# Patient Record
Sex: Female | Born: 1980 | Race: Black or African American | Hispanic: No | Marital: Married | Smoking: Never smoker
Health system: Southern US, Community
[De-identification: ages and names within clinical notes are randomized; demographics above are authoritative.]

## PROBLEM LIST (undated history)

## (undated) DIAGNOSIS — I10 Essential (primary) hypertension: Secondary | ICD-10-CM

## (undated) DIAGNOSIS — B001 Herpesviral vesicular dermatitis: Secondary | ICD-10-CM

## (undated) DIAGNOSIS — E1165 Type 2 diabetes mellitus with hyperglycemia: Principal | ICD-10-CM

## (undated) DIAGNOSIS — N926 Irregular menstruation, unspecified: Secondary | ICD-10-CM

## (undated) DIAGNOSIS — F32A Depression, unspecified: Principal | ICD-10-CM

## (undated) DIAGNOSIS — E119 Type 2 diabetes mellitus without complications: Secondary | ICD-10-CM

## (undated) DIAGNOSIS — F419 Anxiety disorder, unspecified: Secondary | ICD-10-CM

## (undated) DIAGNOSIS — E559 Vitamin D deficiency, unspecified: Secondary | ICD-10-CM

## (undated) HISTORY — DX: Type 2 diabetes mellitus without complications: E11.9

## (undated) HISTORY — DX: Anxiety disorder, unspecified: F41.9

---

## 2004-03-04 ENCOUNTER — Emergency Department: Payer: Self-pay | Admitting: Internal Medicine

## 2013-06-22 ENCOUNTER — Ambulatory Visit: Payer: Self-pay | Admitting: Nurse Practitioner

## 2013-07-07 ENCOUNTER — Ambulatory Visit: Payer: Self-pay | Admitting: Nurse Practitioner

## 2014-01-02 ENCOUNTER — Ambulatory Visit: Payer: Self-pay | Admitting: Emergency Medicine

## 2014-06-15 ENCOUNTER — Other Ambulatory Visit: Payer: Self-pay

## 2014-06-15 DIAGNOSIS — K219 Gastro-esophageal reflux disease without esophagitis: Secondary | ICD-10-CM | POA: Insufficient documentation

## 2014-06-16 ENCOUNTER — Encounter: Payer: Self-pay | Admitting: Family Medicine

## 2014-06-16 ENCOUNTER — Ambulatory Visit (INDEPENDENT_AMBULATORY_CARE_PROVIDER_SITE_OTHER): Payer: No Typology Code available for payment source | Admitting: Family Medicine

## 2014-06-16 ENCOUNTER — Ambulatory Visit: Payer: Self-pay | Admitting: Family Medicine

## 2014-06-16 VITALS — BP 120/70 | HR 70 | Ht 60.0 in | Wt 232.0 lb

## 2014-06-16 DIAGNOSIS — E119 Type 2 diabetes mellitus without complications: Secondary | ICD-10-CM | POA: Diagnosis not present

## 2014-06-16 DIAGNOSIS — F32A Depression, unspecified: Secondary | ICD-10-CM

## 2014-06-16 DIAGNOSIS — F329 Major depressive disorder, single episode, unspecified: Secondary | ICD-10-CM

## 2014-06-16 MED ORDER — PAROXETINE HCL 10 MG PO TABS: 10.0000 mg | ORAL_TABLET | Freq: Every day | ORAL | Status: DC

## 2014-06-16 MED ORDER — METFORMIN HCL 500 MG PO TABS: 500.0000 mg | ORAL_TABLET | Freq: Two times a day (BID) | ORAL | Status: DC

## 2014-06-16 NOTE — Progress Notes (Signed)
Name: Alicia Haynes   MRN: [id number]    DOB: 1980/04/06   Date:06/16/2014       Progress Note  Subjective  Chief Complaint  Chief Complaint  Patient presents with  . Diabetes    A1C- was due in May  . Panic Attack    Diabetes She presents for her follow-up diabetic visit. She has type 2 diabetes mellitus. The initial diagnosis of diabetes was made 1 year ago. Her disease course has been stable. There are no hypoglycemic associated symptoms. Pertinent negatives for diabetes include no blurred vision, no fatigue, no foot paresthesias, no foot ulcerations, no polydipsia, no polyphagia, no polyuria, no visual change and no weight loss. There are no hypoglycemic complications. Symptoms are stable. There are no diabetic complications. Current diabetic treatment includes oral agent (monotherapy). She is compliant with treatment all of the time. Her weight is decreasing steadily. She participates in exercise three times a week. She monitors blood glucose at home 1-2 x per day. Blood glucose monitoring compliance is good. There is no change in her home blood glucose trend. Her breakfast blood glucose is taken between 7-8 am. Her breakfast blood glucose range is generally 90-110 mg/dl. She does not see a podiatrist.Eye exam is current.  Mental Health Problem The primary symptoms include dysphoric mood. The primary symptoms do not include delusions, hallucinations, negative symptoms or somatic symptoms. The current episode started more than 1 month ago.  Additional symptoms of the illness do not include no anhedonia, no insomnia, no unexpected weight change, no fatigue, no euphoric mood, no decreased need for sleep, no poor judgment or no visual change. She does not admit to suicidal ideas. She does not have a plan to commit suicide. She does not contemplate harming herself.    No problem-specific assessment & plan notes found for this encounter.   No past medical history on file.  No past  surgical history on file.  No family history on file.  History   Social History  . Marital Status: Single    Spouse Name: N/A  . Number of Children: N/A  . Years of Education: N/A   Occupational History  . Not on file.   Social History Main Topics  . Smoking status: Never Smoker   . Smokeless tobacco: Not on file  . Alcohol Use: No  . Drug Use: No  . Sexual Activity: Not on file   Other Topics Concern  . Not on file   Social History Narrative  . No narrative on file    Allergies  Allergen Reactions  . Sulfa Antibiotics Swelling     Review of Systems  Constitutional: Negative for fever, chills, weight loss, fatigue and unexpected weight change.  HENT: Negative.   Eyes: Negative.  Negative for blurred vision.  Respiratory: Negative.   Cardiovascular: Negative.   Gastrointestinal: Negative.   Genitourinary: Negative.   Musculoskeletal: Negative.   Skin: Negative.  Negative for rash.  Neurological: Negative.   Endo/Heme/Allergies: Negative.  Negative for polydipsia and polyphagia.  Psychiatric/Behavioral: Positive for depression and dysphoric mood. Negative for hallucinations. The patient does not have insomnia.      Objective  Filed Vitals:   06/16/14 1421  BP: 120/70  Pulse: 70  Height: 5' (1.524 m)  Weight: 232 lb (105.235 kg)    Physical Exam  Constitutional: She is oriented to person, place, and time and well-developed, well-nourished, and in no distress. No distress.  HENT:  Head: Normocephalic and atraumatic.  Right Ear: External ear  normal.  Left Ear: External ear normal.  Nose: Nose normal.  Mouth/Throat: Oropharynx is clear and moist.  Eyes: Conjunctivae and EOM are normal. Pupils are equal, round, and reactive to light.  Neck: Normal range of motion. Neck supple. No thyromegaly present.  Cardiovascular: Normal rate, regular rhythm and intact distal pulses.   No murmur heard. Pulmonary/Chest: Effort normal and breath sounds normal. No  respiratory distress. She has no wheezes. She has no rales.  Abdominal: Soft. Bowel sounds are normal. She exhibits no distension. There is no tenderness. There is no guarding.  Musculoskeletal: Normal range of motion. She exhibits no edema or tenderness.  Lymphadenopathy:    She has no cervical adenopathy.  Neurological: She is alert and oriented to person, place, and time.  Skin: Skin is warm and dry.  Psychiatric: Mood and affect normal.      No results found for this or any previous visit (from the past 2160 hour(s)).   Assessment & Plan  Problem List Items Addressed This Visit    None    Visit Diagnoses    Type 2 diabetes mellitus without complication    -  Primary    Relevant Medications    metFORMIN (GLUCOPHAGE) 500 MG tablet    Other Relevant Orders    HgB A1c    Depression        Relevant Medications    PARoxetine (PAXIL) 10 MG tablet         Dr. Hayden [facility]  06/16/2014

## 2014-06-17 LAB — HEMOGLOBIN A1C
ESTIMATED AVERAGE GLUCOSE: 137 mg/dL
HEMOGLOBIN A1C: 6.4 % — AB (ref 4.8–5.6)

## 2014-09-26 ENCOUNTER — Encounter: Payer: Self-pay | Admitting: Family Medicine

## 2014-09-26 ENCOUNTER — Ambulatory Visit (INDEPENDENT_AMBULATORY_CARE_PROVIDER_SITE_OTHER): Payer: No Typology Code available for payment source | Admitting: Family Medicine

## 2014-09-26 VITALS — BP 110/80 | HR 60 | Ht 60.0 in | Wt 239.0 lb

## 2014-09-26 DIAGNOSIS — F32A Depression, unspecified: Secondary | ICD-10-CM

## 2014-09-26 DIAGNOSIS — J0110 Acute frontal sinusitis, unspecified: Secondary | ICD-10-CM

## 2014-09-26 DIAGNOSIS — F320 Major depressive disorder, single episode, mild: Secondary | ICD-10-CM

## 2014-09-26 DIAGNOSIS — F329 Major depressive disorder, single episode, unspecified: Secondary | ICD-10-CM

## 2014-09-26 MED ORDER — PAROXETINE HCL 10 MG PO TABS: 10.0000 mg | ORAL_TABLET | Freq: Every day | ORAL | Status: DC

## 2014-09-26 MED ORDER — AMOXICILLIN 500 MG PO CAPS
500.0000 mg | ORAL_CAPSULE | Freq: Three times a day (TID) | ORAL | Status: DC
Start: 2014-09-26 — End: 2014-12-19

## 2014-09-26 NOTE — Progress Notes (Addendum)
Name: Alicia Haynes   MRN: [id number]    DOB: [birth year]   Date:09/26/2014       Progress Note  Subjective  Chief Complaint  Chief Complaint  Patient presents with  . Sinusitis    swollen lymph nodes and headache    Sinusitis This is a new problem. The current episode started 1 to 4 weeks ago. The problem has been waxing and waning since onset. The maximum temperature recorded prior to her arrival was 100.4 - 100.9 F. Associated symptoms include chills, congestion, diaphoresis, headaches, neck pain, sinus pressure, a sore throat and swollen glands. Pertinent negatives include no coughing, ear pain, shortness of breath or sneezing. (Bilateral headache/frontal area) Past treatments include acetaminophen and oral decongestants. The treatment provided no relief.    No problem-specific assessment & plan notes found for this encounter.   Past Medical History  Diagnosis Date  . Anxiety   . Diabetes mellitus without complication     History reviewed. No pertinent past surgical history.  Family History  Problem Relation Age of Onset  . Cancer Mother   . Diabetes Mother   . Hypertension Mother     Social History   Social History  . Marital Status: Single    Spouse Name: N/A  . Number of Children: N/A  . Years of Education: N/A   Occupational History  . Not on file.   Social History Main Topics  . Smoking status: Never Smoker   . Smokeless tobacco: Not on file  . Alcohol Use: No  . Drug Use: No  . Sexual Activity: No   Other Topics Concern  . Not on file   Social History Narrative    Allergies  Allergen Reactions  . Sulfa Antibiotics Swelling     Review of Systems  Constitutional: Positive for chills and diaphoresis. Negative for fever, weight loss and malaise/fatigue.  HENT: Positive for congestion, sinus pressure and sore throat. Negative for ear discharge, ear pain and sneezing.   Eyes: Negative for blurred vision.  Respiratory: Negative for cough,  sputum production, shortness of breath and wheezing.   Cardiovascular: Negative for chest pain, palpitations and leg swelling.  Gastrointestinal: Negative for heartburn, nausea, abdominal pain, diarrhea, constipation, blood in stool and melena.  Genitourinary: Negative for dysuria, urgency, frequency and hematuria.  Musculoskeletal: Positive for neck pain. Negative for myalgias, back pain and joint pain.  Skin: Negative for rash.  Neurological: Positive for headaches. Negative for dizziness, tingling, sensory change and focal weakness.  Endo/Heme/Allergies: Negative for environmental allergies and polydipsia. Does not bruise/bleed easily.  Psychiatric/Behavioral: Negative for depression and suicidal ideas. The patient is not nervous/anxious and does not have insomnia.      Objective  Filed Vitals:   09/26/14 1507  BP: 110/80  Pulse: 60  Height: 5' (1.524 m)  Weight: 239 lb (108.41 kg)    Physical Exam  Constitutional: She is well-developed, well-nourished, and in no distress. No distress.  HENT:  Head: Normocephalic and atraumatic.  Right Ear: External ear normal.  Left Ear: External ear normal.  Nose: Nose normal.  Mouth/Throat: Oropharynx is clear and moist.  Eyes: Conjunctivae and EOM are normal. Pupils are equal, round, and reactive to light. Right eye exhibits no discharge. Left eye exhibits no discharge.  Neck: Normal range of motion. Neck supple. No JVD present. No thyromegaly present.  Cardiovascular: Normal rate, regular rhythm, normal heart sounds and intact distal pulses.  Exam reveals no gallop and no friction rub.   No murmur heard.  Pulmonary/Chest: Effort normal and breath sounds normal.  Abdominal: Soft. Bowel sounds are normal. She exhibits no mass. There is no tenderness. There is no guarding.  Musculoskeletal: Normal range of motion. She exhibits no edema.       Cervical back: She exhibits spasm.  Lymphadenopathy:    She has no cervical adenopathy.   Neurological: She is alert. She has normal reflexes.  Skin: Skin is warm and dry. She is not diaphoretic.  Psychiatric: Mood and affect normal.      Assessment & Plan  Problem List Items Addressed This Visit    None    Visit Diagnoses    Acute frontal sinusitis, recurrence not specified    -  Primary    rhinocort sample    Relevant Medications    amoxicillin (AMOXIL) 500 MG capsule    Major depressive disorder, single episode, mild        Relevant Medications    PARoxetine (PAXIL) 10 MG tablet    Depression        Relevant Medications    PARoxetine (PAXIL) 10 MG tablet         Dr. [facility]  09/26/2014

## 2014-10-24 ENCOUNTER — Other Ambulatory Visit: Payer: Self-pay

## 2014-12-19 ENCOUNTER — Encounter: Payer: Self-pay | Admitting: Family Medicine

## 2014-12-19 ENCOUNTER — Ambulatory Visit (INDEPENDENT_AMBULATORY_CARE_PROVIDER_SITE_OTHER): Payer: 59 | Admitting: Family Medicine

## 2014-12-19 VITALS — BP 120/80 | HR 76 | Ht 60.0 in | Wt 244.0 lb

## 2014-12-19 DIAGNOSIS — J0100 Acute maxillary sinusitis, unspecified: Secondary | ICD-10-CM

## 2014-12-19 MED ORDER — GUAIFENESIN-CODEINE 100-10 MG/5ML PO SOLN: 5.0000 mL | Freq: Three times a day (TID) | ORAL | Status: DC | PRN

## 2014-12-19 MED ORDER — AMOXICILLIN 500 MG PO CAPS: 500.0000 mg | ORAL_CAPSULE | Freq: Three times a day (TID) | ORAL | Status: DC

## 2014-12-19 NOTE — Progress Notes (Signed)
Name: Alicia Haynes   MRN: [id number]    DOB: 11-28-1980   Date:12/19/2014       Progress Note  Subjective  Chief Complaint  Chief Complaint  Patient presents with  . Sinusitis    cough and cong- otc not helping    Sinusitis This is a new problem. The current episode started in the past 7 days. The problem has been gradually worsening since onset. There has been no fever. Her pain is at a severity of 2/10. The pain is mild. Pertinent negatives include no chills, congestion, coughing, diaphoresis, ear pain, headaches, hoarse voice, neck pain, shortness of breath, sinus pressure, sneezing, sore throat or swollen glands. Past treatments include acetaminophen. The treatment provided moderate relief.    No problem-specific assessment & plan notes found for this encounter.   Past Medical History  Diagnosis Date  . Anxiety   . Diabetes mellitus without complication (HCC)     History reviewed. No pertinent past surgical history.  Family History  Problem Relation Age of Onset  . Cancer Mother   . Diabetes Mother   . Hypertension Mother     Social History   Social History  . Marital Status: Single    Spouse Name: N/A  . Number of Children: N/A  . Years of Education: N/A   Occupational History  . Not on file.   Social History Main Topics  . Smoking status: Never Smoker   . Smokeless tobacco: Not on file  . Alcohol Use: No  . Drug Use: No  . Sexual Activity: No   Other Topics Concern  . Not on file   Social History Narrative    Allergies  Allergen Reactions  . Sulfa Antibiotics Swelling     Review of Systems  Constitutional: Negative for fever, chills, weight loss, malaise/fatigue and diaphoresis.  HENT: Negative for congestion, ear discharge, ear pain, hoarse voice, sinus pressure, sneezing and sore throat.   Eyes: Negative for blurred vision.  Respiratory: Negative for cough, sputum production, shortness of breath and wheezing.   Cardiovascular:  Negative for chest pain, palpitations and leg swelling.  Gastrointestinal: Negative for heartburn, nausea, abdominal pain, diarrhea, constipation, blood in stool and melena.  Genitourinary: Negative for dysuria, urgency, frequency and hematuria.  Musculoskeletal: Negative for myalgias, back pain, joint pain and neck pain.  Skin: Negative for rash.  Neurological: Negative for dizziness, tingling, sensory change, focal weakness and headaches.  Endo/Heme/Allergies: Negative for environmental allergies and polydipsia. Does not bruise/bleed easily.  Psychiatric/Behavioral: Negative for depression and suicidal ideas. The patient is not nervous/anxious and does not have insomnia.      Objective  Filed Vitals:   12/19/14 1540  BP: 120/80  Pulse: 76  Height: 5' (1.524 m)  Weight: 244 lb (110.678 kg)    Physical Exam  Constitutional: She is well-developed, well-nourished, and in no distress. No distress.  HENT:  Head: Normocephalic and atraumatic.  Right Ear: External ear normal.  Left Ear: External ear normal.  Nose: Nose normal.  Mouth/Throat: Oropharynx is clear and moist.  Eyes: Conjunctivae and EOM are normal. Pupils are equal, round, and reactive to light. Right eye exhibits no discharge. Left eye exhibits no discharge.  Neck: Normal range of motion. Neck supple. No JVD present. No thyromegaly present.  Cardiovascular: Normal rate, regular rhythm, normal heart sounds and intact distal pulses.  Exam reveals no gallop and no friction rub.   No murmur heard. Pulmonary/Chest: Effort normal and breath sounds normal.  Abdominal: Soft. Bowel sounds  are normal. She exhibits no mass. There is no tenderness. There is no guarding.  Musculoskeletal: Normal range of motion. She exhibits no edema.  Lymphadenopathy:    She has no cervical adenopathy.  Neurological: She is alert. She has normal reflexes.  Skin: Skin is warm and dry. She is not diaphoretic.  Psychiatric: Mood and affect normal.   Nursing note and vitals reviewed.     Assessment & Plan  Problem List Items Addressed This Visit    None    Visit Diagnoses    Acute maxillary sinusitis, recurrence not specified    -  Primary    Relevant Medications    amoxicillin (AMOXIL) 500 MG capsule    guaiFENesin-codeine 100-10 MG/5ML syrup         Dr. Hayden Rasmusseneanna [facility]  12/19/2014

## 2014-12-26 ENCOUNTER — Ambulatory Visit: Payer: Self-pay | Admitting: Family Medicine

## 2015-01-25 ENCOUNTER — Encounter: Payer: Self-pay | Admitting: Family Medicine

## 2015-01-25 ENCOUNTER — Ambulatory Visit (INDEPENDENT_AMBULATORY_CARE_PROVIDER_SITE_OTHER): Payer: 59 | Admitting: Family Medicine

## 2015-01-25 VITALS — BP 118/88 | HR 70 | Ht 60.0 in | Wt 242.0 lb

## 2015-01-25 DIAGNOSIS — F32A Depression, unspecified: Secondary | ICD-10-CM

## 2015-01-25 DIAGNOSIS — F329 Major depressive disorder, single episode, unspecified: Secondary | ICD-10-CM

## 2015-01-25 DIAGNOSIS — E119 Type 2 diabetes mellitus without complications: Secondary | ICD-10-CM | POA: Insufficient documentation

## 2015-01-25 DIAGNOSIS — E669 Obesity, unspecified: Secondary | ICD-10-CM

## 2015-01-25 DIAGNOSIS — E785 Hyperlipidemia, unspecified: Secondary | ICD-10-CM

## 2015-01-25 MED ORDER — PAROXETINE HCL 10 MG PO TABS: 10.0000 mg | ORAL_TABLET | Freq: Every day | ORAL | Status: DC

## 2015-01-25 MED ORDER — METFORMIN HCL ER (MOD) 500 MG PO TB24: 500.0000 mg | ORAL_TABLET | Freq: Every day | ORAL | Status: DC

## 2015-01-25 NOTE — Progress Notes (Signed)
Name: Alicia Haynes   MRN: [id number]    DOB: 07/25/1980   Date:01/25/2015       Progress Note  Subjective  Chief Complaint  Chief Complaint  Patient presents with  . Diabetes    wants to change to ER version of Metformin    Diabetes She presents for her follow-up diabetic visit. She has type 2 diabetes mellitus. Her disease course has been stable. There are no hypoglycemic associated symptoms. Pertinent negatives for hypoglycemia include no dizziness, headaches or nervousness/anxiousness. Pertinent negatives for diabetes include no blurred vision, no chest pain, no fatigue, no foot paresthesias, no foot ulcerations, no polydipsia, no polyphagia, no polyuria, no visual change, no weakness and no weight loss. There are no hypoglycemic complications. Symptoms are stable. There are no diabetic complications. There are no known risk factors for coronary artery disease. Current diabetic treatment includes oral agent (monotherapy). She is following a generally healthy diet. She has not had a previous visit with a dietitian. She participates in exercise intermittently. Her breakfast blood glucose is taken between 8-9 am. Her breakfast blood glucose range is generally 110-130 mg/dl. An ACE inhibitor/angiotensin II receptor blocker is not being taken. She does not see a podiatrist.Eye exam is current (5/16).  Depression        This is a chronic problem.  The problem has been gradually improving since onset.  Associated symptoms include no decreased concentration, no fatigue, no helplessness, no hopelessness, does not have insomnia, no decreased interest, no myalgias, no headaches and no suicidal ideas.  Past treatments include SSRIs - Selective serotonin reuptake inhibitors.  Compliance with treatment is good.  Previous treatment provided moderate relief.   No problem-specific assessment & plan notes found for this encounter.   Past Medical History  Diagnosis Date  . Anxiety   . Diabetes mellitus  without complication (HCC)     History reviewed. No pertinent past surgical history.  Family History  Problem Relation Age of Onset  . Cancer Mother   . Diabetes Mother   . Hypertension Mother     Social History   Social History  . Marital Status: Single    Spouse Name: N/A  . Number of Children: N/A  . Years of Education: N/A   Occupational History  . Not on file.   Social History Main Topics  . Smoking status: Never Smoker   . Smokeless tobacco: Not on file  . Alcohol Use: No  . Drug Use: No  . Sexual Activity: No   Other Topics Concern  . Not on file   Social History Narrative    Allergies  Allergen Reactions  . Sulfa Antibiotics Swelling     Review of Systems  Constitutional: Negative for fever, chills, weight loss, malaise/fatigue and fatigue.  HENT: Negative for ear discharge, ear pain and sore throat.   Eyes: Negative for blurred vision.  Respiratory: Negative for cough, sputum production, shortness of breath and wheezing.   Cardiovascular: Negative for chest pain, palpitations and leg swelling.  Gastrointestinal: Negative for heartburn, nausea, abdominal pain, diarrhea, constipation, blood in stool and melena.  Genitourinary: Negative for dysuria, urgency, frequency and hematuria.  Musculoskeletal: Negative for myalgias, back pain, joint pain and neck pain.  Skin: Negative for rash.  Neurological: Negative for dizziness, tingling, sensory change, focal weakness, weakness and headaches.  Endo/Heme/Allergies: Negative for environmental allergies, polydipsia and polyphagia. Does not bruise/bleed easily.  Psychiatric/Behavioral: Positive for depression. Negative for suicidal ideas and decreased concentration. The patient is not nervous/anxious and  does not have insomnia.      Objective  Filed Vitals:   01/25/15 0808  BP: 118/88  Pulse: 70  Height: 5' (1.524 m)  Weight: 242 lb (109.77 kg)    Physical Exam  Constitutional: She is well-developed,  well-nourished, and in no distress. No distress.  HENT:  Head: Normocephalic and atraumatic.  Right Ear: External ear normal.  Left Ear: External ear normal.  Nose: Nose normal.  Mouth/Throat: Oropharynx is clear and moist.  Eyes: Conjunctivae and EOM are normal. Pupils are equal, round, and reactive to light. Right eye exhibits no discharge. Left eye exhibits no discharge.  Neck: Normal range of motion. Neck supple. No JVD present. No thyromegaly present.  Cardiovascular: Normal rate, regular rhythm, normal heart sounds and intact distal pulses.  Exam reveals no gallop and no friction rub.   No murmur heard. Pulmonary/Chest: Effort normal and breath sounds normal.  Abdominal: Soft. Bowel sounds are normal. She exhibits no mass. There is no tenderness. There is no guarding.  Musculoskeletal: Normal range of motion. She exhibits no edema.  Lymphadenopathy:    She has no cervical adenopathy.  Neurological: She is alert. She has normal sensation, normal reflexes and intact cranial nerves.  Skin: Skin is warm and dry. She is not diaphoretic.  Psychiatric: Mood and affect normal.  Nursing note and vitals reviewed.     Assessment & Plan  Problem List Items Addressed This Visit      Endocrine   Type 2 diabetes mellitus without complication, without long-term current use of insulin (HCC) - Primary   Relevant Medications   metFORMIN (GLUMETZA) 500 MG (MOD) 24 hr tablet   Other Relevant Orders   HgB A1c   Renal Function Panel   Urine Microalbumin w/creat. ratio    Other Visit Diagnoses    Depression        Relevant Medications    PARoxetine (PAXIL) 10 MG tablet    Hyperlipidemia        Relevant Orders    Lipid Profile    Obesity        Relevant Medications    metFORMIN (GLUMETZA) 500 MG (MOD) 24 hr tablet    Other Relevant Orders    Lipid Profile         Dr. Elizabeth Sauer [facility]  01/25/2015

## 2015-01-25 NOTE — Patient Instructions (Signed)

## 2015-01-26 LAB — RENAL FUNCTION PANEL
Albumin: 4.4 g/dL (ref 3.5–5.5)
BUN/Creatinine Ratio: 17 (ref 8–20)
BUN: 13 mg/dL (ref 6–20)
CO2: 23 mmol/L (ref 18–29)
Calcium: 9.8 mg/dL (ref 8.7–10.2)
Chloride: 99 mmol/L (ref 96–106)
Creatinine, Ser: 0.76 mg/dL (ref 0.57–1.00)
GFR calc Af Amer: 118 mL/min/{1.73_m2} (ref >59)
GFR calc non Af Amer: 103 mL/min/{1.73_m2} (ref >59)
Glucose: 103 mg/dL — ABNORMAL HIGH (ref 65–99)
Phosphorus: 4.4 mg/dL (ref 2.5–4.5)
Potassium: 4.3 mmol/L (ref 3.5–5.2)
Sodium: 137 mmol/L (ref 134–144)

## 2015-01-26 LAB — MICROALBUMIN / CREATININE URINE RATIO
Creatinine, Urine: 268.3 mg/dL
MICROALB/CREAT RATIO: 5 mg/g creat (ref 0.0–30.0)
Microalbumin, Urine: 13.3 ug/mL

## 2015-01-26 LAB — LIPID PANEL
CHOL/HDL RATIO: 2.7 ratio (ref 0.0–4.4)
Cholesterol, Total: 175 mg/dL (ref 100–199)
HDL: 64 mg/dL (ref >39)
LDL CALC: 87 mg/dL (ref 0–99)
Triglycerides: 122 mg/dL (ref 0–149)
VLDL CHOLESTEROL CAL: 24 mg/dL (ref 5–40)

## 2015-01-26 LAB — HEMOGLOBIN A1C
ESTIMATED AVERAGE GLUCOSE: 154 mg/dL
HEMOGLOBIN A1C: 7 % — AB (ref 4.8–5.6)

## 2015-01-26 MED ORDER — METFORMIN HCL 1000 MG PO TABS: 1000.0000 mg | ORAL_TABLET | Freq: Two times a day (BID) | ORAL | Status: DC

## 2015-01-26 NOTE — Addendum Note (Signed)
Addended by: Everitt Amber on: 01/26/2015 04:50 PM   Modules accepted: Orders, Medications

## 2015-03-29 ENCOUNTER — Ambulatory Visit (INDEPENDENT_AMBULATORY_CARE_PROVIDER_SITE_OTHER): Payer: 59 | Admitting: Family Medicine

## 2015-03-29 ENCOUNTER — Encounter: Payer: Self-pay | Admitting: Family Medicine

## 2015-03-29 VITALS — BP 120/78 | HR 78 | Ht 60.0 in | Wt 243.0 lb

## 2015-03-29 DIAGNOSIS — F329 Major depressive disorder, single episode, unspecified: Secondary | ICD-10-CM

## 2015-03-29 DIAGNOSIS — F32A Depression, unspecified: Secondary | ICD-10-CM

## 2015-03-29 DIAGNOSIS — E119 Type 2 diabetes mellitus without complications: Secondary | ICD-10-CM | POA: Diagnosis not present

## 2015-03-29 DIAGNOSIS — F419 Anxiety disorder, unspecified: Secondary | ICD-10-CM

## 2015-03-29 MED ORDER — METFORMIN HCL 1000 MG PO TABS: 1000.0000 mg | ORAL_TABLET | Freq: Two times a day (BID) | ORAL | Status: DC

## 2015-03-29 MED ORDER — PAROXETINE HCL 10 MG PO TABS: 10.0000 mg | ORAL_TABLET | Freq: Every day | ORAL | Status: DC

## 2015-03-29 NOTE — Progress Notes (Signed)
Name: Alicia Haynes   MRN: [id number]    DOB: [birth year]   Date:03/29/2015       Progress Note  Subjective  Chief Complaint  Chief Complaint  Patient presents with  . Diabetes  . Anxiety    Diabetes She presents for her follow-up diabetic visit. She has type 2 diabetes mellitus. Her disease course has been stable. Pertinent negatives for hypoglycemia include no dizziness, headaches or nervousness/anxiousness. Pertinent negatives for diabetes include no blurred vision, no chest pain, no fatigue, no foot paresthesias, no foot ulcerations, no polydipsia, no polyphagia, no polyuria, no visual change, no weakness and no weight loss. There are no hypoglycemic complications. Symptoms are stable. There are no diabetic complications. Pertinent negatives for diabetic complications include no CVA, heart disease, nephropathy, peripheral neuropathy, PVD or retinopathy. Current diabetic treatment includes oral agent (monotherapy). Her weight is stable. She is following a generally healthy diet. Her breakfast blood glucose is taken between 8-9 am. Her breakfast blood glucose range is generally 90-110 mg/dl. An ACE inhibitor/angiotensin II receptor blocker is not being taken. She does not see a podiatrist.Eye exam is current.  Anxiety Presents for follow-up visit. Patient reports no chest pain, depressed mood, dizziness, excessive worry, feeling of choking, insomnia, irritability, nausea, nervous/anxious behavior, palpitations, panic, shortness of breath or suicidal ideas. Symptoms occur occasionally. The severity of symptoms is mild. Nothing aggravates the symptoms. The quality of sleep is good.   Compliance with prior treatments has been good.    No problem-specific assessment & plan notes found for this encounter.   Past Medical History  Diagnosis Date  . Anxiety   . Diabetes mellitus without complication (HCC)     History reviewed. No pertinent past surgical history.  Family History  Problem  Relation Age of Onset  . Cancer Mother   . Diabetes Mother   . Hypertension Mother     Social History   Social History  . Marital Status: Single    Spouse Name: N/A  . Number of Children: N/A  . Years of Education: N/A   Occupational History  . Not on file.   Social History Main Topics  . Smoking status: Never Smoker   . Smokeless tobacco: Not on file  . Alcohol Use: No  . Drug Use: No  . Sexual Activity: No   Other Topics Concern  . Not on file   Social History Narrative    Allergies  Allergen Reactions  . Sulfa Antibiotics Swelling     Review of Systems  Constitutional: Negative for fever, chills, weight loss, malaise/fatigue, irritability and fatigue.  HENT: Negative for ear discharge, ear pain and sore throat.   Eyes: Negative for blurred vision.  Respiratory: Negative for cough, sputum production, shortness of breath and wheezing.   Cardiovascular: Negative for chest pain, palpitations and leg swelling.  Gastrointestinal: Negative for heartburn, nausea, abdominal pain, diarrhea, constipation, blood in stool and melena.  Genitourinary: Negative for dysuria, urgency, frequency and hematuria.  Musculoskeletal: Negative for myalgias, back pain, joint pain and neck pain.  Skin: Negative for rash.  Neurological: Negative for dizziness, tingling, sensory change, focal weakness, weakness and headaches.  Endo/Heme/Allergies: Negative for environmental allergies, polydipsia and polyphagia. Does not bruise/bleed easily.  Psychiatric/Behavioral: Negative for depression and suicidal ideas. The patient is not nervous/anxious and does not have insomnia.      Objective  Filed Vitals:   03/29/15 0805  BP: 120/78  Pulse: 78  Height: 5' (1.524 m)  Weight: 243 lb (110.224 kg)  Physical Exam  Constitutional: She is well-developed, well-nourished, and in no distress. No distress.  HENT:  Head: Normocephalic and atraumatic.  Right Ear: External ear normal.  Left  Ear: External ear normal.  Nose: Nose normal.  Mouth/Throat: Oropharynx is clear and moist.  Eyes: Conjunctivae and EOM are normal. Pupils are equal, round, and reactive to light. Right eye exhibits no discharge. Left eye exhibits no discharge.  Neck: Normal range of motion. Neck supple. No JVD present. No thyromegaly present.  Cardiovascular: Normal rate, regular rhythm, normal heart sounds and intact distal pulses.  Exam reveals no gallop and no friction rub.   No murmur heard. Pulmonary/Chest: Effort normal and breath sounds normal.  Abdominal: Soft. Bowel sounds are normal. She exhibits no mass. There is no tenderness. There is no guarding.  Musculoskeletal: Normal range of motion. She exhibits no edema.  Lymphadenopathy:    She has no cervical adenopathy.  Neurological: She is alert.  Skin: Skin is warm and dry. She is not diaphoretic.  Psychiatric: Mood and affect normal.      Assessment & Plan  Problem List Items Addressed This Visit      Endocrine   Type 2 diabetes mellitus without complication, without long-term current use of insulin (HCC) - Primary   Relevant Medications   metFORMIN (GLUCOPHAGE) 1000 MG tablet   Other Relevant Orders   Hemoglobin A1c   Microalbumin / creatinine urine ratio    Other Visit Diagnoses    Chronic anxiety        Relevant Medications    PARoxetine (PAXIL) 10 MG tablet    Depression        Relevant Medications    PARoxetine (PAXIL) 10 MG tablet         Dr. Hayden Rasmusseneanna Jones [facility]  03/29/2015

## 2015-03-30 LAB — HEMOGLOBIN A1C
ESTIMATED AVERAGE GLUCOSE: 143 mg/dL
HEMOGLOBIN A1C: 6.6 % — AB (ref 4.8–5.6)

## 2015-03-30 LAB — MICROALBUMIN / CREATININE URINE RATIO
Creatinine, Urine: 282.2 mg/dL
MICROALB/CREAT RATIO: 28.2 mg/g{creat} (ref 0.0–30.0)
Microalbumin, Urine: 79.7 ug/mL

## 2015-07-25 ENCOUNTER — Ambulatory Visit: Payer: 59 | Admitting: Family Medicine

## 2015-08-21 ENCOUNTER — Ambulatory Visit (INDEPENDENT_AMBULATORY_CARE_PROVIDER_SITE_OTHER): Payer: 59 | Admitting: Family Medicine

## 2015-08-21 ENCOUNTER — Encounter: Payer: Self-pay | Admitting: Family Medicine

## 2015-08-21 VITALS — BP 110/80 | HR 78 | Ht 60.0 in | Wt 247.0 lb

## 2015-08-21 DIAGNOSIS — E119 Type 2 diabetes mellitus without complications: Secondary | ICD-10-CM | POA: Diagnosis not present

## 2015-08-21 DIAGNOSIS — F419 Anxiety disorder, unspecified: Secondary | ICD-10-CM | POA: Diagnosis not present

## 2015-08-21 MED ORDER — METFORMIN HCL 1000 MG PO TABS: 1000.0000 mg | ORAL_TABLET | Freq: Two times a day (BID) | ORAL | 6 refills | Status: DC

## 2015-08-21 MED ORDER — BUSPIRONE HCL 7.5 MG PO TABS: 7.5000 mg | ORAL_TABLET | Freq: Two times a day (BID) | ORAL | 5 refills | Status: DC

## 2015-08-21 NOTE — Progress Notes (Signed)
Name: Alicia Haynes   MRN: [id number]    DOB: Dec 30, 1980   Date:08/21/2015       Progress Note  Subjective  Chief Complaint  Chief Complaint  Patient presents with  . Diabetes  . Anxiety    Diabetes  She presents for her follow-up diabetic visit. She has type 2 diabetes mellitus. Her disease course has been improving. Hypoglycemia symptoms include nervousness/anxiousness. Pertinent negatives for hypoglycemia include no dizziness or headaches. There are no diabetic associated symptoms. Pertinent negatives for diabetes include no blurred vision, no chest pain, no fatigue, no foot paresthesias, no foot ulcerations, no polydipsia, no polyphagia, no polyuria, no visual change, no weakness and no weight loss. There are no hypoglycemic complications. Symptoms are stable. There are no diabetic complications. There are no known risk factors for coronary artery disease. Current diabetic treatment includes oral agent (monotherapy). She is compliant with treatment all of the time. Her weight is increasing steadily. She is following a generally healthy diet. Diabetic meal planning: cutting back on portions. She participates in exercise daily. Her home blood glucose trend is fluctuating minimally. Her breakfast blood glucose is taken between 8-9 am. Her lunch blood glucose range is generally 90-110 mg/dl. An ACE inhibitor/angiotensin II receptor blocker is not being taken. She does not see a podiatrist.Eye exam is current.  Anxiety  Presents for follow-up visit. Symptoms include excessive worry and nervous/anxious behavior. Patient reports no chest pain, compulsions, depressed mood, dizziness, insomnia, irritability, muscle tension, nausea, palpitations, shortness of breath or suicidal ideas. Symptoms occur occasionally. The quality of sleep is good.   Treatment side effects: unable to lose weight.    No problem-specific Assessment & Plan notes found for this encounter.   Past Medical History:   Diagnosis Date  . Anxiety   . Diabetes mellitus without complication (HCC)     History reviewed. No pertinent surgical history.  Family History  Problem Relation Age of Onset  . Cancer Mother   . Diabetes Mother   . Hypertension Mother     Social History   Social History  . Marital status: Single    Spouse name: N/A  . Number of children: N/A  . Years of education: N/A   Occupational History  . Not on file.   Social History Main Topics  . Smoking status: Never Smoker  . Smokeless tobacco: Not on file  . Alcohol use No  . Drug use: No  . Sexual activity: No   Other Topics Concern  . Not on file   Social History Narrative  . No narrative on file    Allergies  Allergen Reactions  . Sulfa Antibiotics Swelling     Review of Systems  Constitutional: Negative for chills, fatigue, fever, irritability, malaise/fatigue and weight loss.  HENT: Negative for ear discharge, ear pain and sore throat.   Eyes: Negative for blurred vision.  Respiratory: Negative for cough, sputum production, shortness of breath and wheezing.   Cardiovascular: Negative for chest pain, palpitations and leg swelling.  Gastrointestinal: Negative for abdominal pain, blood in stool, constipation, diarrhea, heartburn, melena and nausea.  Genitourinary: Negative for dysuria, frequency, hematuria and urgency.  Musculoskeletal: Negative for back pain, joint pain, myalgias and neck pain.  Skin: Negative for rash.  Neurological: Negative for dizziness, tingling, sensory change, focal weakness, weakness and headaches.  Endo/Heme/Allergies: Negative for environmental allergies, polydipsia and polyphagia. Does not bruise/bleed easily.  Psychiatric/Behavioral: Negative for depression and suicidal ideas. The patient is nervous/anxious. The patient does not have insomnia.  Objective  Vitals:   08/21/15 0818  BP: 110/80  Pulse: 78  Weight: 247 lb (112 kg)  Height: 5' (1.524 m)    Physical Exam   Constitutional: She is well-developed, well-nourished, and in no distress. No distress.  HENT:  Head: Normocephalic and atraumatic.  Right Ear: External ear normal.  Left Ear: External ear normal.  Nose: Nose normal.  Mouth/Throat: Oropharynx is clear and moist.  Eyes: Conjunctivae and EOM are normal. Pupils are equal, round, and reactive to light. Right eye exhibits no discharge. Left eye exhibits no discharge.  Neck: Normal range of motion. Neck supple. No JVD present. No thyromegaly present.  Cardiovascular: Normal rate, regular rhythm, S1 normal, S2 normal, normal heart sounds and intact distal pulses.  Exam reveals no gallop and no friction rub.   No murmur heard. Pulses:      Dorsalis pedis pulses are 2+ on the right side, and 2+ on the left side.       Posterior tibial pulses are 2+ on the right side, and 2+ on the left side.  Pulmonary/Chest: Effort normal and breath sounds normal. She has no wheezes. She has no rales.  Abdominal: Soft. Bowel sounds are normal. She exhibits no mass. There is no tenderness. There is no guarding.  Musculoskeletal: Normal range of motion. She exhibits no edema.  Lymphadenopathy:    She has no cervical adenopathy.  Neurological: She is alert. She has normal reflexes.  Skin: Skin is warm, dry and intact. She is not diaphoretic.  Psychiatric: Mood and affect normal.      Assessment & Plan  Problem List Items Addressed This Visit      Endocrine   Type 2 diabetes mellitus without complication, without long-term current use of insulin (HCC)   Relevant Medications   metFORMIN (GLUCOPHAGE) 1000 MG tablet   Other Relevant Orders   Renal Function Panel   Hemoglobin A1c   Lipid Profile    Other Visit Diagnoses    Chronic anxiety    -  Primary   Relevant Medications   busPIRone (BUSPAR) 7.5 MG tablet        Dr. Hayden Rasmusseneanna Jones [facility]  08/21/15

## 2015-08-22 ENCOUNTER — Ambulatory Visit: Payer: 59 | Admitting: Family Medicine

## 2015-08-22 LAB — RENAL FUNCTION PANEL
ALBUMIN: 4 g/dL (ref 3.5–5.5)
BUN/Creatinine Ratio: 18 (ref 9–23)
BUN: 14 mg/dL (ref 6–20)
CALCIUM: 9 mg/dL (ref 8.7–10.2)
CO2: 22 mmol/L (ref 18–29)
CREATININE: 0.78 mg/dL (ref 0.57–1.00)
Chloride: 97 mmol/L (ref 96–106)
GFR calc Af Amer: 114 mL/min/{1.73_m2} (ref >59)
GFR, EST NON AFRICAN AMERICAN: 99 mL/min/{1.73_m2} (ref >59)
GLUCOSE: 112 mg/dL — AB (ref 65–99)
PHOSPHORUS: 4.3 mg/dL (ref 2.5–4.5)
POTASSIUM: 4.1 mmol/L (ref 3.5–5.2)
SODIUM: 138 mmol/L (ref 134–144)

## 2015-08-22 LAB — LIPID PANEL
CHOL/HDL RATIO: 2.6 ratio (ref 0.0–4.4)
Cholesterol, Total: 160 mg/dL (ref 100–199)
HDL: 62 mg/dL (ref >39)
LDL CALC: 82 mg/dL (ref 0–99)
Triglycerides: 81 mg/dL (ref 0–149)
VLDL CHOLESTEROL CAL: 16 mg/dL (ref 5–40)

## 2015-08-22 LAB — HEMOGLOBIN A1C
ESTIMATED AVERAGE GLUCOSE: 137 mg/dL
HEMOGLOBIN A1C: 6.4 % — AB (ref 4.8–5.6)

## 2015-09-26 ENCOUNTER — Other Ambulatory Visit: Payer: Self-pay

## 2015-09-26 DIAGNOSIS — F419 Anxiety disorder, unspecified: Secondary | ICD-10-CM

## 2015-09-26 MED ORDER — SERTRALINE HCL 50 MG PO TABS: 50.0000 mg | ORAL_TABLET | Freq: Every day | ORAL | 1 refills | Status: DC

## 2015-12-02 ENCOUNTER — Other Ambulatory Visit: Payer: Self-pay | Admitting: Family Medicine

## 2016-02-21 ENCOUNTER — Ambulatory Visit (INDEPENDENT_AMBULATORY_CARE_PROVIDER_SITE_OTHER): Payer: BLUE CROSS/BLUE SHIELD | Admitting: Family Medicine

## 2016-02-21 VITALS — BP 120/70 | HR 70 | Ht 60.0 in | Wt 250.0 lb

## 2016-02-21 DIAGNOSIS — E119 Type 2 diabetes mellitus without complications: Secondary | ICD-10-CM

## 2016-02-21 DIAGNOSIS — F419 Anxiety disorder, unspecified: Secondary | ICD-10-CM | POA: Diagnosis not present

## 2016-02-21 DIAGNOSIS — E559 Vitamin D deficiency, unspecified: Secondary | ICD-10-CM

## 2016-02-21 DIAGNOSIS — Z23 Encounter for immunization: Secondary | ICD-10-CM | POA: Diagnosis not present

## 2016-02-21 MED ORDER — GLUCOSE BLOOD VI STRP: 1.0000 | ORAL_STRIP | Freq: Every day | 2 refills | Status: DC

## 2016-02-21 MED ORDER — SERTRALINE HCL 50 MG PO TABS: 50.0000 mg | ORAL_TABLET | Freq: Every day | ORAL | 3 refills | Status: DC

## 2016-02-21 MED ORDER — METFORMIN HCL 1000 MG PO TABS: 1000.0000 mg | ORAL_TABLET | Freq: Two times a day (BID) | ORAL | 3 refills | Status: DC

## 2016-02-21 MED ORDER — ONETOUCH DELICA LANCETS FINE MISC: 1.0000 | Freq: Every day | 2 refills | Status: DC

## 2016-02-21 MED ORDER — VITAMIN D 1000 UNITS PO TABS: 1000.0000 [IU] | ORAL_TABLET | ORAL | 6 refills | Status: AC

## 2016-02-21 NOTE — Progress Notes (Signed)
Name: Alicia Haynes   MRN: [id number]    DOB: Sep 06, 1980   Date:02/21/2016       Progress Note  Subjective  Chief Complaint  Chief Complaint  Patient presents with  . Diabetes  . Depression    Diabetes  She presents for her follow-up diabetic visit. She has type 2 diabetes mellitus. Her disease course has been stable. Hypoglycemia symptoms include nervousness/anxiousness. Pertinent negatives for hypoglycemia include no confusion, dizziness or headaches. Pertinent negatives for diabetes include no blurred vision, no chest pain, no foot paresthesias, no foot ulcerations, no polydipsia, no polyphagia, no polyuria, no visual change, no weakness and no weight loss. There are no hypoglycemic complications. Symptoms are stable. There are no diabetic complications. Current diabetic treatment includes oral agent (monotherapy). She is compliant with treatment most of the time. Her weight is stable. She is following a generally healthy diet. Meal planning includes avoidance of concentrated sweets, calorie counting and carbohydrate counting. She participates in exercise daily. Her home blood glucose trend is fluctuating minimally. Her breakfast blood glucose is taken between 8-9 am. Her breakfast blood glucose range is generally 110-130 mg/dl. An ACE inhibitor/angiotensin II receptor blocker is not being taken. She does not see a podiatrist.Eye exam is current.  Anxiety  Presents for follow-up visit. Symptoms include irritability and nervous/anxious behavior. Patient reports no chest pain, compulsions, confusion, decreased concentration, depressed mood, dizziness, excessive worry, insomnia, malaise, muscle tension, nausea, obsessions, palpitations, panic, restlessness, shortness of breath or suicidal ideas. The severity of symptoms is mild.   Compliance with medications is 76-100%.    No problem-specific Assessment & Plan notes found for this encounter.   Past Medical History:  Diagnosis Date  .  Anxiety   . Diabetes mellitus without complication (HCC)     No past surgical history on file.  Family History  Problem Relation Age of Onset  . Cancer Mother   . Diabetes Mother   . Hypertension Mother     Social History   Social History  . Marital status: Single    Spouse name: N/A  . Number of children: N/A  . Years of education: N/A   Occupational History  . Not on file.   Social History Main Topics  . Smoking status: Never Smoker  . Smokeless tobacco: Not on file  . Alcohol use No  . Drug use: No  . Sexual activity: No   Other Topics Concern  . Not on file   Social History Narrative  . No narrative on file    Allergies  Allergen Reactions  . Sulfa Antibiotics Swelling     Review of Systems  Constitutional: Positive for irritability. Negative for chills, fever, malaise/fatigue and weight loss.  HENT: Negative for ear discharge, ear pain, hearing loss, sinus pain and sore throat.   Eyes: Negative for blurred vision.  Respiratory: Negative for cough, sputum production, shortness of breath, wheezing and stridor.   Cardiovascular: Negative for chest pain, palpitations and leg swelling.  Gastrointestinal: Negative for abdominal pain, blood in stool, constipation, diarrhea, heartburn, melena and nausea.  Genitourinary: Negative for dysuria, frequency, hematuria and urgency.  Musculoskeletal: Negative for back pain, joint pain, myalgias and neck pain.  Skin: Negative for rash.  Neurological: Negative for dizziness, tingling, sensory change, focal weakness, weakness and headaches.  Endo/Heme/Allergies: Negative for environmental allergies, polydipsia and polyphagia. Does not bruise/bleed easily.  Psychiatric/Behavioral: Negative for confusion, decreased concentration, depression and suicidal ideas. The patient is nervous/anxious. The patient does not have insomnia.  Objective  Vitals:   02/21/16 0812  BP: 120/70  Pulse: 70  Weight: 250 lb (113.4 kg)   Height: 5' (1.524 m)    Physical Exam  Constitutional: She is well-developed, well-nourished, and in no distress. No distress.  HENT:  Head: Normocephalic and atraumatic.  Right Ear: Tympanic membrane, external ear and ear canal normal.  Left Ear: Tympanic membrane, external ear and ear canal normal.  Nose: Nose normal.  Mouth/Throat: Oropharynx is clear and moist.  Eyes: Conjunctivae and EOM are normal. Pupils are equal, round, and reactive to light. Right eye exhibits no discharge. Left eye exhibits no discharge.  Neck: Normal range of motion. Neck supple. No JVD present. No thyromegaly present.  Cardiovascular: Normal rate, regular rhythm, normal heart sounds and intact distal pulses.  Exam reveals no gallop and no friction rub.   No murmur heard. Pulmonary/Chest: Effort normal and breath sounds normal. She has no wheezes. She has no rales.  Abdominal: Soft. Bowel sounds are normal. She exhibits no mass. There is no tenderness. There is no guarding.  Musculoskeletal: Normal range of motion. She exhibits no edema.  Lymphadenopathy:    She has no cervical adenopathy.  Neurological: She is alert. She has normal reflexes.  Skin: Skin is warm and dry. She is not diaphoretic.  Psychiatric: Mood and affect normal.  Nursing note and vitals reviewed.     Assessment & Plan  Problem List Items Addressed This Visit      Endocrine   Type 2 diabetes mellitus without complication, without long-term current use of insulin (HCC) - Primary   Relevant Medications   metFORMIN (GLUCOPHAGE) 1000 MG tablet   ONETOUCH DELICA LANCETS FINE MISC   glucose blood (ONE TOUCH ULTRA TEST) test strip   Other Relevant Orders   Hemoglobin A1c   Renal Function Panel   Microalbumin / creatinine urine ratio     Other   Chronic anxiety   Relevant Medications   sertraline (ZOLOFT) 50 MG tablet   Vitamin D deficiency   Relevant Medications   cholecalciferol (VITAMIN D) 1000 units tablet    Other Visit  Diagnoses    Immunization due       Relevant Orders   Tdap vaccine greater than or equal to 7yo IM (Completed)        Dr. Hayden Rasmusseneanna Jones [facility]  02/21/16

## 2016-02-22 LAB — RENAL FUNCTION PANEL
ALBUMIN: 4 g/dL (ref 3.5–5.5)
BUN/Creatinine Ratio: 23 (ref 9–23)
BUN: 14 mg/dL (ref 6–20)
CALCIUM: 9.2 mg/dL (ref 8.7–10.2)
CO2: 25 mmol/L (ref 18–29)
CREATININE: 0.61 mg/dL (ref 0.57–1.00)
Chloride: 99 mmol/L (ref 96–106)
GFR calc Af Amer: 136 mL/min/{1.73_m2} (ref >59)
GFR calc non Af Amer: 118 mL/min/{1.73_m2} (ref >59)
Glucose: 83 mg/dL (ref 65–99)
PHOSPHORUS: 4.2 mg/dL (ref 2.5–4.5)
POTASSIUM: 4.4 mmol/L (ref 3.5–5.2)
SODIUM: 140 mmol/L (ref 134–144)

## 2016-02-22 LAB — MICROALBUMIN / CREATININE URINE RATIO
Creatinine, Urine: 206.7 mg/dL
MICROALB/CREAT RATIO: 5.8 mg/g{creat} (ref 0.0–30.0)
Microalbumin, Urine: 12 ug/mL

## 2016-02-22 LAB — HEMOGLOBIN A1C
ESTIMATED AVERAGE GLUCOSE: 143 mg/dL
Hgb A1c MFr Bld: 6.6 % — ABNORMAL HIGH (ref 4.8–5.6)

## 2016-03-29 ENCOUNTER — Encounter: Payer: Self-pay | Admitting: Obstetrics & Gynecology

## 2016-03-29 ENCOUNTER — Ambulatory Visit (INDEPENDENT_AMBULATORY_CARE_PROVIDER_SITE_OTHER): Payer: BLUE CROSS/BLUE SHIELD | Admitting: Obstetrics & Gynecology

## 2016-03-29 VITALS — BP 110/70 | HR 100 | Ht 60.0 in | Wt 247.0 lb

## 2016-03-29 DIAGNOSIS — Z0000 Encounter for general adult medical examination without abnormal findings: Secondary | ICD-10-CM | POA: Diagnosis not present

## 2016-03-29 MED ORDER — NORETHIN ACE-ETH ESTRAD-FE 1-20 MG-MCG PO TABS: 1.0000 | ORAL_TABLET | Freq: Every day | ORAL | 4 refills | Status: DC

## 2016-03-29 NOTE — Addendum Note (Signed)
Addended by: Nadara MustardHARRIS, Kohl Polinsky P on: 03/29/2016 04:07 PM   Modules accepted: Orders

## 2016-03-29 NOTE — Progress Notes (Signed)
HPI:      [name] is a 36 y.o. No obstetric history on file. who LMP was Patient's last menstrual period was 03/25/2016., she presents today for her annual examination. The patient has no complaints today. The patient is sexually active.  2015 last pap: was normal  The patient has regular exercise: yes.   Trying to lose weight, sees diabetic dietician  GYN History: Contraception: OCP (estrogen/progesterone)  PMHx: She  has a past medical history of Anxiety and Diabetes mellitus without complication (HCC). Also,  has no past surgical history on file., family history includes Cancer in her mother; Diabetes in her mother; Hypertension in her mother.,  reports that she has never smoked. She has never used smokeless tobacco. She reports that she does not drink alcohol or use drugs.  She has a current medication list which includes the following prescription(s): cholecalciferol, glucose blood, metformin, norethindrone-ethinyl estradiol, onetouch delica lancets fine, and sertraline. Also, is allergic to sulfa antibiotics.  Review of Systems  Constitutional: Negative for chills, fever and malaise/fatigue.  HENT: Negative for congestion, sinus pain and sore throat.   Eyes: Negative for blurred vision and pain.  Respiratory: Negative for cough and wheezing.   Cardiovascular: Negative for chest pain and leg swelling.  Gastrointestinal: Negative for abdominal pain, constipation, diarrhea, heartburn, nausea and vomiting.  Genitourinary: Negative for dysuria, frequency, hematuria and urgency.  Musculoskeletal: Negative for back pain, joint pain, myalgias and neck pain.  Skin: Negative for itching and rash.  Neurological: Negative for dizziness, tremors and weakness.  Endo/Heme/Allergies: Does not bruise/bleed easily.  Psychiatric/Behavioral: Negative for depression. The patient is not nervous/anxious and does not have insomnia.     Objective: BP 110/70   Pulse 100   Ht 5' (1.524  m)   Wt 247 lb (112 kg)   LMP 03/25/2016   BMI 48.24 kg/m  Physical Exam  Constitutional: She is oriented to person, place, and time. She appears well-developed and well-nourished. No distress.  Genitourinary: Rectum normal, vagina normal and uterus normal. Pelvic exam was performed with patient supine. There is no rash or lesion on the right labia. There is no rash or lesion on the left labia. Vagina exhibits no lesion. No bleeding in the vagina. Right adnexum does not display mass and does not display tenderness. Left adnexum does not display mass and does not display tenderness. Cervix does not exhibit motion tenderness, lesion, friability or polyp.   Uterus is mobile and midaxial. Uterus is not enlarged or exhibiting a mass.  Genitourinary Comments: Limited exam due to obesity  HENT:  Head: Normocephalic and atraumatic. Head is without laceration.  Right Ear: Hearing normal.  Left Ear: Hearing normal.  Nose: No epistaxis.  No foreign bodies.  Mouth/Throat: Uvula is midline, oropharynx is clear and moist and mucous membranes are normal.  Eyes: Pupils are equal, round, and reactive to light.  Neck: Normal range of motion. Neck supple. No thyromegaly present.  Cardiovascular: Normal rate and regular rhythm.  Exam reveals no gallop and no friction rub.   No murmur heard. Pulmonary/Chest: Effort normal and breath sounds normal. No respiratory distress. She has no wheezes.  Abdominal: Soft. Bowel sounds are normal. She exhibits no distension. There is no tenderness. There is no rebound.  Musculoskeletal: Normal range of motion.  Neurological: She is alert and oriented to person, place, and time. No cranial nerve deficit.  Skin: Skin is warm and dry.  Psychiatric: She has a normal mood and affect. Judgment normal.  Vitals reviewed.   Assessment:  ANNUAL EXAM 1. Annual physical exam      Screening Plan:            1.  Cervical Screening-  Pap smear done today  2. Breast screening-  Exam annually and mammogram>40 planned   3. Colonoscopy every 10 years, Hemoccult testing - after age 36  4. Labs per PCP   5. Counseling for contraception: oral contraceptives (estrogen/progesterone) - norethindrone-ethinyl estradiol (JUNEL FE,GILDESS FE,LOESTRIN FE) 1-20 MG-MCG tablet; Take 1 tablet by mouth daily.  Dispense: 3 Package; Refill: 4      F/U  Return in about 1 year (around 03/29/2017) for Annual.  Annamarie MajorPaul Darryel Diodato, MD, Merlinda FrederickFACOG [facility] 03/29/2016  2:55 PM

## 2016-04-02 LAB — IGP, APTIMA HPV
HPV Aptima: NEGATIVE
PAP Smear Comment: 0

## 2016-04-10 ENCOUNTER — Other Ambulatory Visit: Payer: Self-pay | Admitting: Obstetrics & Gynecology

## 2016-04-23 DIAGNOSIS — M9903 Segmental and somatic dysfunction of lumbar region: Secondary | ICD-10-CM | POA: Diagnosis not present

## 2016-04-23 DIAGNOSIS — M5136 Other intervertebral disc degeneration, lumbar region: Secondary | ICD-10-CM | POA: Diagnosis not present

## 2016-04-23 DIAGNOSIS — M9905 Segmental and somatic dysfunction of pelvic region: Secondary | ICD-10-CM | POA: Diagnosis not present

## 2016-04-23 DIAGNOSIS — M955 Acquired deformity of pelvis: Secondary | ICD-10-CM | POA: Diagnosis not present

## 2016-05-07 DIAGNOSIS — M5136 Other intervertebral disc degeneration, lumbar region: Secondary | ICD-10-CM | POA: Diagnosis not present

## 2016-05-07 DIAGNOSIS — M9905 Segmental and somatic dysfunction of pelvic region: Secondary | ICD-10-CM | POA: Diagnosis not present

## 2016-05-07 DIAGNOSIS — M9903 Segmental and somatic dysfunction of lumbar region: Secondary | ICD-10-CM | POA: Diagnosis not present

## 2016-05-07 DIAGNOSIS — M955 Acquired deformity of pelvis: Secondary | ICD-10-CM | POA: Diagnosis not present

## 2016-06-06 ENCOUNTER — Ambulatory Visit
Admission: EM | Admit: 2016-06-06 | Discharge: 2016-06-06 | Disposition: A | Discharge status: Home / Self Care | Payer: BLUE CROSS/BLUE SHIELD | Attending: Family Medicine | Admitting: Family Medicine

## 2016-06-06 DIAGNOSIS — S39012A Strain of muscle, fascia and tendon of lower back, initial encounter: Secondary | ICD-10-CM

## 2016-06-06 LAB — URINALYSIS, COMPLETE (UACMP) WITH MICROSCOPIC
Bilirubin Urine: NEGATIVE
Glucose, UA: NEGATIVE mg/dL
Nitrite: NEGATIVE
PROTEIN: NEGATIVE mg/dL
Specific Gravity, Urine: 1.025 (ref 1.005–1.030)
pH: 6 (ref 5.0–8.0)

## 2016-06-06 MED ORDER — METAXALONE 800 MG PO TABS: 800.0000 mg | ORAL_TABLET | Freq: Three times a day (TID) | ORAL | 0 refills | Status: DC

## 2016-06-06 MED ORDER — NAPROXEN 500 MG PO TABS: 500.0000 mg | ORAL_TABLET | Freq: Two times a day (BID) | ORAL | 0 refills | Status: DC

## 2016-06-06 NOTE — ED Triage Notes (Signed)
Pt reports low back pain and cloudy urine since Tuesday.

## 2016-06-06 NOTE — ED Provider Notes (Signed)
CSN: [id number]     Arrival date & time 06/06/16  [id number] History   First MD Initiated Contact with Patient 06/06/16 [phone]     Chief Complaint  Patient presents with  . Back Pain   (Consider location/radiation/quality/duration/timing/severity/associated sxs/prior Treatment) HPI  [name] who presents with the 2 days prior to this visit. She denies any dysuria frequency urgency fever or chills or vaginal discharge. Not remember any injury to her low back. She did take a 3R car trip to [place] over the weekend is the only change in her usual pattern of activities. She has no radicular symptoms denies any numbness or tingling. She has never had a urinary tract infection before.         Past Medical History:  Diagnosis Date  . Anxiety   . Diabetes mellitus without complication (HCC)    History reviewed. No pertinent surgical history. Family History  Problem Relation Age of Onset  . Cancer Mother   . Diabetes Mother   . Hypertension Mother    Social History  Substance Use Topics  . Smoking status: Never Smoker  . Smokeless tobacco: Never Used  . Alcohol use No   OB History    No data available     Review of Systems  Constitutional: Positive for activity change. Negative for appetite change, chills, fatigue and fever.  Genitourinary: Negative for difficulty urinating, dyspareunia, dysuria, frequency, hematuria, urgency and vaginal discharge.  All other systems reviewed and are negative.   Allergies  Sulfa antibiotics  Home Medications   Prior to Admission medications   Medication Sig Start Date End Date Taking? Authorizing Provider  cholecalciferol (VITAMIN D) 1000 units tablet Take 1 tablet (1,000 Units total) by mouth 3 (three) times a week. 02/21/16   Alicia Haynes, Alicia C, MD  glucose blood (ONE TOUCH ULTRA TEST) test strip 1 each by Other route daily. 02/21/16   Alicia Haynes, Alicia C, MD  metaxalone (SKELAXIN) 800 MG tablet Take 1 tablet (800 mg total) by mouth 3  (three) times daily. 06/06/16   Alicia Haynes, Alicia Tootle P, PA-Haynes  metFORMIN (GLUCOPHAGE) 1000 MG tablet Take 1 tablet (1,000 mg total) by mouth 2 (two) times daily with a meal. Change to this med 02/21/16   Alicia Haynes, Alicia C, MD  naproxen (NAPROSYN) 500 MG tablet Take 1 tablet (500 mg total) by mouth 2 (two) times daily with a meal. 06/06/16   Alicia Haynes, Alicia Haynes P, PA-Haynes  norethindrone-ethinyl estradiol (JUNEL FE,GILDESS FE,LOESTRIN FE) 1-20 MG-MCG tablet Take 1 tablet by mouth daily. 03/29/16   Alicia Haynes, Alicia P, MD  [facility] DEL[facility] LANCETS FINE MISC 1 each by Does not apply route daily. 02/21/16   Alicia Haynes, Alicia C, MD  sertraline (ZOLOFT) 50 MG tablet Take 1 tablet (50 mg total) by mouth daily. 02/21/16   Alicia Haynes, Alicia C, MD   Meds Ordered and Administered this Visit  Medications - No data to display  BP 113/87 (BP Location: Left Arm)   Pulse 83   Temp 99.3 F (37.4 Haynes) (Oral)   Resp 18   Ht 5' (1.524 m)   Wt 248 lb (112.5 kg)   LMP 05/23/2016   SpO2 99%   BMI 48.43 kg/m  No data found.   Physical Exam  Urgent Care Course     Procedures (including critical care time)  Labs Review Labs Reviewed  URINALYSIS, COMPLETE (UACMP) WITH MICROSCOPIC - Abnormal; Notable for the following:       Result Value   APPearance HAZY (*)  Hgb urine dipstick TRACE (*)    Ketones, ur TRACE (*)    Leukocytes, UA SMALL (*)    Squamous Epithelial / LPF 6-30 (*)    Bacteria, UA FEW (*)    All other components within normal limits  URINE CULTURE    Imaging Review No results found.   Visual Acuity Review  Right Eye Distance:   Left Eye Distance:   Bilateral Distance:    Right Eye Near:   Left Eye Near:    Bilateral Near:         MDM   1. Strain of lumbar region, initial encounter    Discharge Medication List as of 06/06/2016  9:31 AM    START taking these medications   Details  metaxalone (SKELAXIN) 800 MG tablet Take 1 tablet (800 mg total) by mouth 3 (three) times daily., Starting Thu  06/06/2016, Normal    naproxen (NAPROSYN) 500 MG tablet Take 1 tablet (500 mg total) by mouth 2 (two) times daily with a meal., Starting Thu 06/06/2016, Normal      Plan: 1. Test/x-ray results and diagnosis reviewed with patient 2. rx as per orders; risks, benefits, potential side effects reviewed with patient 3. Recommend supportive treatment with Rest and symptom avoidance. Avoid sitting lifting or bending. May use of Biofreeze 3 times daily for comfort. Follow-up with her primary care physician if you are not improving. Although it is unlikely that you have urinary tract infection I will culture that urine issue were concerned regarding the UTI 4. F/u prn if symptoms worsen or don't improve     Alicia Feil, PA-Haynes 06/06/16 1610

## 2016-06-07 LAB — URINE CULTURE

## 2016-06-11 DIAGNOSIS — M5136 Other intervertebral disc degeneration, lumbar region: Secondary | ICD-10-CM | POA: Diagnosis not present

## 2016-06-11 DIAGNOSIS — M955 Acquired deformity of pelvis: Secondary | ICD-10-CM | POA: Diagnosis not present

## 2016-06-11 DIAGNOSIS — M9903 Segmental and somatic dysfunction of lumbar region: Secondary | ICD-10-CM | POA: Diagnosis not present

## 2016-06-11 DIAGNOSIS — M9905 Segmental and somatic dysfunction of pelvic region: Secondary | ICD-10-CM | POA: Diagnosis not present

## 2016-10-21 ENCOUNTER — Encounter: Payer: Self-pay | Admitting: Family Medicine

## 2016-10-21 ENCOUNTER — Ambulatory Visit (INDEPENDENT_AMBULATORY_CARE_PROVIDER_SITE_OTHER): Payer: BLUE CROSS/BLUE SHIELD | Admitting: Family Medicine

## 2016-10-21 VITALS — BP 120/80 | HR 68 | Ht 60.0 in | Wt 250.0 lb

## 2016-10-21 DIAGNOSIS — N049 Nephrotic syndrome with unspecified morphologic changes: Secondary | ICD-10-CM | POA: Diagnosis not present

## 2016-10-21 DIAGNOSIS — Z9189 Other specified personal risk factors, not elsewhere classified: Secondary | ICD-10-CM

## 2016-10-21 DIAGNOSIS — E119 Type 2 diabetes mellitus without complications: Secondary | ICD-10-CM

## 2016-10-21 DIAGNOSIS — Z6841 Body Mass Index (BMI) 40.0 and over, adult: Secondary | ICD-10-CM

## 2016-10-21 DIAGNOSIS — Z23 Encounter for immunization: Secondary | ICD-10-CM

## 2016-10-21 DIAGNOSIS — F419 Anxiety disorder, unspecified: Secondary | ICD-10-CM | POA: Diagnosis not present

## 2016-10-21 DIAGNOSIS — K219 Gastro-esophageal reflux disease without esophagitis: Secondary | ICD-10-CM

## 2016-10-21 MED ORDER — SERTRALINE HCL 50 MG PO TABS: 50.0000 mg | ORAL_TABLET | Freq: Every day | ORAL | 3 refills | Status: DC

## 2016-10-21 MED ORDER — METFORMIN HCL 1000 MG PO TABS: 1000.0000 mg | ORAL_TABLET | Freq: Two times a day (BID) | ORAL | 3 refills | Status: DC

## 2016-10-21 NOTE — Progress Notes (Signed)
Name: Alicia Haynes   MRN: [id number]    DOB: 20-Jan-1976   Date:10/21/2012       Progress Note  Subjective  Chief Complaint  Chief Complaint  Patient presents with  . Diabetes  . Depression    Diabetes  She presents for her follow-up diabetic visit. She has type 2 diabetes mellitus. Her disease course has been stable. There are no hypoglycemic associated symptoms. Pertinent negatives for hypoglycemia include no dizziness, headaches or nervousness/anxiousness. Pertinent negatives for diabetes include no blurred vision, no chest pain, no fatigue, no foot paresthesias, no foot ulcerations, no polydipsia, no polyphagia, no polyuria, no visual change, no weakness and no weight loss. There are no hypoglycemic complications. Symptoms are stable. There are no diabetic complications. There are no known risk factors for coronary artery disease. Current diabetic treatment includes oral agent (monotherapy). She is compliant with treatment all of the time. Her weight is stable. She is following a generally healthy diet. Meal planning includes avoidance of concentrated sweets and carbohydrate counting. She participates in exercise daily. Her home blood glucose trend is decreasing rapidly. Her breakfast blood glucose is taken between 8-9 am. Her breakfast blood glucose range is generally 110-130 mg/dl. An ACE inhibitor/angiotensin II receptor blocker is not being taken. She does not see a podiatrist.Eye exam is current.  Depression         This is a chronic problem.  The current episode started more than 1 year ago.   The onset quality is gradual.   The problem occurs intermittently.  The problem has been gradually improving since onset.  Associated symptoms include no decreased concentration, no fatigue, no helplessness, no hopelessness, does not have insomnia, not irritable, no restlessness, no decreased interest, no appetite change, no myalgias, no headaches, not sad and no suicidal ideas.     The symptoms  are aggravated by nothing.  Past treatments include SSRIs - Selective serotonin reuptake inhibitors.  Compliance with treatment is good.   No problem-specific Assessment & Plan notes found for this encounter.   Past Medical History:  Diagnosis Date  . Anxiety   . Diabetes mellitus without complication (HCC)     No past surgical history on file.  Family History  Problem Relation Age of Onset  . Cancer Mother   . Diabetes Mother   . Hypertension Mother     Social History   Social History  . Marital status: Married    Spouse name: N/A  . Number of children: N/A  . Years of education: N/A   Occupational History  . Not on file.   Social History Main Topics  . Smoking status: Never Smoker  . Smokeless tobacco: Never Used  . Alcohol use No  . Drug use: No  . Sexual activity: Not on file   Other Topics Concern  . Not on file   Social History Narrative  . No narrative on file    Allergies  Allergen Reactions  . Sulfa Antibiotics Swelling    Outpatient Medications Prior to Visit  Medication Sig Dispense Refill  . cholecalciferol (VITAMIN D) 1000 units tablet Take 1 tablet (1,000 Units total) by mouth 3 (three) times a week. 100 tablet 6  . glucose blood (ONE TOUCH ULTRA TEST) test strip 1 each by Other route daily. 100 each 2  . norethindrone-ethinyl estradiol (JUNEL FE,GILDESS FE,LOESTRIN FE) 1-20 MG-MCG tablet Take 1 tablet by mouth daily. 3 Package 4  . ONETOUCH DELICA LANCETS FINE MISC 1 each by Does not apply  route daily. 100 each 2  . metFORMIN (GLUCOPHAGE) 1000 MG tablet Take 1 tablet (1,000 mg total) by mouth 2 (two) times daily with a meal. Change to this med 180 tablet 3  . sertraline (ZOLOFT) 50 MG tablet Take 1 tablet (50 mg total) by mouth daily. 90 tablet 3  . metaxalone (SKELAXIN) 800 MG tablet Take 1 tablet (800 mg total) by mouth 3 (three) times daily. 21 tablet 0  . naproxen (NAPROSYN) 500 MG tablet Take 1 tablet (500 mg total) by mouth 2 (two)  times daily with a meal. 60 tablet 0   No facility-administered medications prior to visit.     Review of Systems  Constitutional: Negative for appetite change, chills, fatigue, fever, malaise/fatigue and weight loss.  HENT: Negative for ear discharge, ear pain and sore throat.   Eyes: Negative for blurred vision.  Respiratory: Negative for cough, sputum production, shortness of breath and wheezing.   Cardiovascular: Negative for chest pain, palpitations and leg swelling.  Gastrointestinal: Negative for abdominal pain, blood in stool, constipation, diarrhea, heartburn, melena and nausea.  Genitourinary: Negative for dysuria, frequency, hematuria and urgency.  Musculoskeletal: Negative for back pain, joint pain, myalgias and neck pain.  Skin: Negative for rash.  Neurological: Negative for dizziness, tingling, sensory change, focal weakness, weakness and headaches.  Endo/Heme/Allergies: Negative for environmental allergies, polydipsia and polyphagia. Does not bruise/bleed easily.  Psychiatric/Behavioral: Positive for depression. Negative for decreased concentration and suicidal ideas. The patient is not nervous/anxious and does not have insomnia.      Objective  Vitals:   10/21/16 0905  BP: 120/80  Pulse: 68  Weight: 250 lb (113.4 kg)  Height: 5' (1.524 m)    Physical Exam  Constitutional: She is well-developed, well-nourished, and in no distress. She is not irritable. No distress.  HENT:  Head: Normocephalic and atraumatic.  Right Ear: External ear normal.  Left Ear: External ear normal.  Nose: Nose normal.  Mouth/Throat: Oropharynx is clear and moist.  Eyes: Pupils are equal, round, and reactive to light. Conjunctivae and EOM are normal. Right eye exhibits no discharge. Left eye exhibits no discharge.  Neck: Normal range of motion. Neck supple. No JVD present. No thyromegaly present.  Cardiovascular: Normal rate, regular rhythm, normal heart sounds and intact distal pulses.   Exam reveals no gallop and no friction rub.   No murmur heard. Pulmonary/Chest: Effort normal and breath sounds normal. She has no wheezes. She has no rales.  Abdominal: Soft. Bowel sounds are normal. She exhibits no mass. There is no tenderness. There is no guarding.  Musculoskeletal: Normal range of motion. She exhibits no edema.  Lymphadenopathy:    She has no cervical adenopathy.  Neurological: She is alert. She has normal sensation, normal strength, normal reflexes and intact cranial nerves.  Monofilament normal  Skin: Skin is warm and dry. She is not diaphoretic.  Psychiatric: Mood and affect normal.  Nursing note and vitals reviewed.     Assessment & Plan  Problem List Items Addressed This Visit      Digestive   Acid reflux     Endocrine   Type 2 diabetes mellitus without complication, without long-term current use of insulin (HCC) - Primary   Relevant Medications   metFORMIN (GLUCOPHAGE) 1000 MG tablet   Other Relevant Orders   Renal Function Panel   Microalbumin / creatinine urine ratio   Hemoglobin A1c   Lipid Profile     Other   Chronic anxiety   Relevant Medications  sertraline (ZOLOFT) 50 MG tablet    Other Visit Diagnoses    Class 3 severe obesity due to excess calories with serious comorbidity and body mass index (BMI) of 40.0 to 44.9 in adult [facility])       Relevant Medications   metFORMIN (GLUCOPHAGE) 1000 MG tablet   Other Relevant Orders   Lipid Profile   23-polyvalent pneumococcal polysaccharide vaccine indication of nephrotic syndrome in patient 68 to 36 years of age       Relevant Orders   Pneumococcal polysaccharide vaccine 23-valent greater than or equal to 2yo subcutaneous/IM (Completed)      Meds ordered this encounter  Medications  . metFORMIN (GLUCOPHAGE) 1000 MG tablet    Sig: Take 1 tablet (1,000 mg total) by mouth 2 (two) times daily with a meal. Change to this med    Dispense:  180 tablet    Refill:  3  . sertraline (ZOLOFT) 50 MG  tablet    Sig: Take 1 tablet (50 mg total) by mouth daily.    Dispense:  90 tablet    Refill:  3      Dr. Elizabeth Sauer [facility]  10/21/16

## 2016-10-22 LAB — RENAL FUNCTION PANEL
ALBUMIN: 4.1 g/dL (ref 3.5–5.5)
BUN/Creatinine Ratio: 15 (ref 9–23)
BUN: 12 mg/dL (ref 6–20)
CO2: 23 mmol/L (ref 20–29)
Calcium: 9.6 mg/dL (ref 8.7–10.2)
Chloride: 98 mmol/L (ref 96–106)
Creatinine, Ser: 0.78 mg/dL (ref 0.57–1.00)
GFR, EST AFRICAN AMERICAN: 113 mL/min/{1.73_m2} (ref >59)
GFR, EST NON AFRICAN AMERICAN: 98 mL/min/{1.73_m2} (ref >59)
Glucose: 108 mg/dL — ABNORMAL HIGH (ref 65–99)
POTASSIUM: 4.2 mmol/L (ref 3.5–5.2)
Phosphorus: 4.4 mg/dL (ref 2.5–4.5)
Sodium: 139 mmol/L (ref 134–144)

## 2016-10-22 LAB — MICROALBUMIN / CREATININE URINE RATIO
CREATININE, UR: 240.7 mg/dL
MICROALBUM., U, RANDOM: 10.9 ug/mL
Microalb/Creat Ratio: 4.5 mg/g creat (ref 0.0–30.0)

## 2016-10-22 LAB — LIPID PANEL
CHOL/HDL RATIO: 2.7 ratio (ref 0.0–4.4)
Cholesterol, Total: 153 mg/dL (ref 100–199)
HDL: 57 mg/dL (ref >39)
LDL CALC: 74 mg/dL (ref 0–99)
Triglycerides: 108 mg/dL (ref 0–149)
VLDL CHOLESTEROL CAL: 22 mg/dL (ref 5–40)

## 2016-10-22 LAB — HEMOGLOBIN A1C
Est. average glucose Bld gHb Est-mCnc: 157 mg/dL
Hgb A1c MFr Bld: 7.1 % — ABNORMAL HIGH (ref 4.8–5.6)

## 2016-11-14 DIAGNOSIS — M955 Acquired deformity of pelvis: Secondary | ICD-10-CM | POA: Diagnosis not present

## 2016-11-14 DIAGNOSIS — M9903 Segmental and somatic dysfunction of lumbar region: Secondary | ICD-10-CM | POA: Diagnosis not present

## 2016-11-14 DIAGNOSIS — M5136 Other intervertebral disc degeneration, lumbar region: Secondary | ICD-10-CM | POA: Diagnosis not present

## 2016-11-14 DIAGNOSIS — M9905 Segmental and somatic dysfunction of pelvic region: Secondary | ICD-10-CM | POA: Diagnosis not present

## 2016-12-26 DIAGNOSIS — M9903 Segmental and somatic dysfunction of lumbar region: Secondary | ICD-10-CM | POA: Diagnosis not present

## 2016-12-26 DIAGNOSIS — M955 Acquired deformity of pelvis: Secondary | ICD-10-CM | POA: Diagnosis not present

## 2016-12-26 DIAGNOSIS — M9905 Segmental and somatic dysfunction of pelvic region: Secondary | ICD-10-CM | POA: Diagnosis not present

## 2016-12-26 DIAGNOSIS — M5136 Other intervertebral disc degeneration, lumbar region: Secondary | ICD-10-CM | POA: Diagnosis not present

## 2017-02-03 DIAGNOSIS — M545 Low back pain: Secondary | ICD-10-CM | POA: Diagnosis not present

## 2017-03-19 ENCOUNTER — Ambulatory Visit: Payer: BLUE CROSS/BLUE SHIELD | Admitting: Family Medicine

## 2017-03-19 ENCOUNTER — Encounter: Payer: Self-pay | Admitting: Family Medicine

## 2017-03-19 DIAGNOSIS — F419 Anxiety disorder, unspecified: Secondary | ICD-10-CM | POA: Diagnosis not present

## 2017-03-19 DIAGNOSIS — E119 Type 2 diabetes mellitus without complications: Secondary | ICD-10-CM

## 2017-03-19 MED ORDER — METFORMIN HCL 1000 MG PO TABS: 1000.0000 mg | ORAL_TABLET | Freq: Two times a day (BID) | ORAL | 3 refills | Status: DC

## 2017-03-19 MED ORDER — SERTRALINE HCL 50 MG PO TABS: 50.0000 mg | ORAL_TABLET | Freq: Every day | ORAL | 3 refills | Status: DC

## 2017-03-19 NOTE — Progress Notes (Signed)
Name: Alicia Haynes   MRN: [id number]    DOB: [birth year]   Date:03/19/2017       Progress Note  Subjective  Chief Complaint  Chief Complaint  Patient presents with  . Depression  . Diabetes    Depression         This is a chronic problem.  The current episode started more than 1 year ago.   The problem occurs intermittently.  The problem has been waxing and waning since onset.  Associated symptoms include no decreased concentration, no fatigue, no helplessness, no hopelessness, does not have insomnia, not irritable, no restlessness, no decreased interest, no appetite change, no body aches, no myalgias, no headaches, no indigestion, not sad and no suicidal ideas.     The symptoms are aggravated by nothing.  Past treatments include SSRIs - Selective serotonin reuptake inhibitors.  Compliance with treatment is good.  Previous treatment provided mild relief. Diabetes  She presents for her follow-up diabetic visit. She has type 2 diabetes mellitus. Her disease course has been stable. Pertinent negatives for hypoglycemia include no confusion, dizziness, headaches or nervousness/anxiousness. Associated symptoms include weight loss. Pertinent negatives for diabetes include no blurred vision, no chest pain, no fatigue, no foot paresthesias, no foot ulcerations, no polydipsia, no polyphagia, no polyuria, no visual change and no weakness. (Decreased carbs/exercise at gym) Symptoms are stable. There are no diabetic complications. Pertinent negatives for diabetic complications include no CVA, heart disease or nephropathy. Risk factors for coronary artery disease include diabetes mellitus, dyslipidemia and obesity. Current diabetic treatment includes oral agent (monotherapy). She is compliant with treatment all of the time. Her weight is stable. She is following a generally healthy (low carb) diet. Meal planning includes avoidance of concentrated sweets. She participates in exercise daily. There is no change  in her home blood glucose trend. Her breakfast blood glucose is taken between 8-9 am. Her breakfast blood glucose range is generally 90-110 mg/dl. An ACE inhibitor/angiotensin II receptor blocker is being taken. She does not see a podiatrist.Eye exam is current.    No problem-specific Assessment & Plan notes found for this encounter.   Past Medical History:  Diagnosis Date  . Anxiety   . Diabetes mellitus without complication (HCC)     No past surgical history on file.  Family History  Problem Relation Age of Onset  . Cancer Mother   . Diabetes Mother   . Hypertension Mother     Social History   Socioeconomic History  . Marital status: Married    Spouse name: Not on file  . Number of children: Not on file  . Years of education: Not on file  . Highest education level: Not on file  Social Needs  . Financial resource strain: Not on file  . Food insecurity - worry: Not on file  . Food insecurity - inability: Not on file  . Transportation needs - medical: Not on file  . Transportation needs - non-medical: Not on file  Occupational History  . Not on file  Tobacco Use  . Smoking status: Never Smoker  . Smokeless tobacco: Never Used  Substance and Sexual Activity  . Alcohol use: No    Alcohol/week: 0.0 oz  . Drug use: No  . Sexual activity: Not on file  Other Topics Concern  . Not on file  Social History Narrative  . Not on file    Allergies  Allergen Reactions  . Sulfa Antibiotics Swelling    Outpatient Medications Prior to Visit  Medication Sig Dispense Refill  . cholecalciferol (VITAMIN D) 1000 units tablet Take 1 tablet (1,000 Units total) by mouth 3 (three) times a week. 100 tablet 6  . glucose blood (ONE TOUCH ULTRA TEST) test strip 1 each by Other route daily. 100 each 2  . norethindrone-ethinyl estradiol (JUNEL FE,GILDESS FE,LOESTRIN FE) 1-20 MG-MCG tablet Take 1 tablet by mouth daily. 3 Package 4  . ONETOUCH DELICA LANCETS FINE MISC 1 each by Does not  apply route daily. 100 each 2  . metFORMIN (GLUCOPHAGE) 1000 MG tablet Take 1 tablet (1,000 mg total) by mouth 2 (two) times daily with a meal. Change to this med 180 tablet 3  . sertraline (ZOLOFT) 50 MG tablet Take 1 tablet (50 mg total) by mouth daily. 90 tablet 3   No facility-administered medications prior to visit.     Review of Systems  Constitutional: Positive for weight loss. Negative for appetite change, chills, fatigue, fever and malaise/fatigue.  HENT: Negative for ear discharge, ear pain and sore throat.   Eyes: Negative for blurred vision.  Respiratory: Negative for cough, sputum production, shortness of breath and wheezing.   Cardiovascular: Negative for chest pain, palpitations and leg swelling.  Gastrointestinal: Negative for abdominal pain, blood in stool, constipation, diarrhea, heartburn, melena and nausea.  Genitourinary: Negative for dysuria, frequency, hematuria and urgency.  Musculoskeletal: Negative for back pain, joint pain, myalgias and neck pain.  Skin: Negative for rash.  Neurological: Negative for dizziness, tingling, sensory change, focal weakness, weakness and headaches.  Endo/Heme/Allergies: Negative for environmental allergies, polydipsia and polyphagia. Does not bruise/bleed easily.  Psychiatric/Behavioral: Positive for depression. Negative for confusion, decreased concentration and suicidal ideas. The patient is not nervous/anxious and does not have insomnia.      Objective  Vitals:   03/19/17 1102  BP: 130/70  Pulse: 72  Weight: 252 lb (114.3 kg)  Height: 5\' 2"  (1.575 m)    Physical Exam  Constitutional: She is well-developed, well-nourished, and in no distress. She is not irritable. No distress.  HENT:  Head: Normocephalic and atraumatic.  Right Ear: Tympanic membrane, external ear and ear canal normal.  Left Ear: Tympanic membrane, external ear and ear canal normal.  Nose: Nose normal.  Mouth/Throat: Uvula is midline, oropharynx is clear  and moist and mucous membranes are normal.  Eyes: Conjunctivae and EOM are normal. Pupils are equal, round, and reactive to light. Right eye exhibits no discharge. Left eye exhibits no discharge.  Neck: Normal range of motion. Neck supple. No JVD present. No thyromegaly present.  Cardiovascular: Normal rate, regular rhythm, normal heart sounds and intact distal pulses. Exam reveals no gallop and no friction rub.  No murmur heard. Pulmonary/Chest: Effort normal and breath sounds normal. She has no wheezes. She has no rales.  Abdominal: Soft. Bowel sounds are normal. She exhibits no mass. There is no tenderness. There is no guarding.  Musculoskeletal: Normal range of motion. She exhibits no edema.  Lymphadenopathy:    She has no cervical adenopathy.  Neurological: She is alert. She has normal reflexes.  Skin: Skin is warm and dry. She is not diaphoretic.  Psychiatric: Mood and affect normal.  Nursing note and vitals reviewed.     Assessment & Plan  Problem List Items Addressed This Visit      Endocrine   Type 2 diabetes mellitus without complication, without long-term current use of insulin (HCC)   Relevant Medications   metFORMIN (GLUCOPHAGE) 1000 MG tablet   Other Relevant Orders   Hemoglobin A1c  Renal Function Panel   Lipid panel     Other   Chronic anxiety   Relevant Medications   sertraline (ZOLOFT) 50 MG tablet      Meds ordered this encounter  Medications  . sertraline (ZOLOFT) 50 MG tablet    Sig: Take 1 tablet (50 mg total) by mouth daily.    Dispense:  90 tablet    Refill:  3  . metFORMIN (GLUCOPHAGE) 1000 MG tablet    Sig: Take 1 tablet (1,000 mg total) by mouth 2 (two) times daily with a meal. Change to this med    Dispense:  180 tablet    Refill:  3      Dr. [facility]  03/19/17

## 2017-03-20 LAB — LIPID PANEL
Chol/HDL Ratio: 2.9 ratio (ref 0.0–4.4)
Cholesterol, Total: 172 mg/dL (ref 100–199)
HDL: 60 mg/dL (ref >39)
LDL Calculated: 95 mg/dL (ref 0–99)
Triglycerides: 86 mg/dL (ref 0–149)
VLDL Cholesterol Cal: 17 mg/dL (ref 5–40)

## 2017-03-20 LAB — RENAL FUNCTION PANEL
Albumin: 4.6 g/dL (ref 3.5–5.5)
BUN / CREAT RATIO: 15 (ref 9–23)
BUN: 12 mg/dL (ref 6–20)
CO2: 23 mmol/L (ref 20–29)
CREATININE: 0.81 mg/dL (ref 0.57–1.00)
Calcium: 9.7 mg/dL (ref 8.7–10.2)
Chloride: 101 mmol/L (ref 96–106)
GFR calc Af Amer: 108 mL/min/{1.73_m2} (ref >59)
GFR calc non Af Amer: 94 mL/min/{1.73_m2} (ref >59)
Glucose: 88 mg/dL (ref 65–99)
Phosphorus: 3.9 mg/dL (ref 2.5–4.5)
Potassium: 4.7 mmol/L (ref 3.5–5.2)
SODIUM: 140 mmol/L (ref 134–144)

## 2017-03-20 LAB — HEMOGLOBIN A1C
ESTIMATED AVERAGE GLUCOSE: 148 mg/dL
HEMOGLOBIN A1C: 6.8 % — AB (ref 4.8–5.6)

## 2017-03-31 ENCOUNTER — Ambulatory Visit: Payer: BLUE CROSS/BLUE SHIELD | Admitting: Obstetrics & Gynecology

## 2017-04-10 ENCOUNTER — Encounter: Payer: Self-pay | Admitting: Obstetrics & Gynecology

## 2017-04-10 ENCOUNTER — Ambulatory Visit (INDEPENDENT_AMBULATORY_CARE_PROVIDER_SITE_OTHER): Payer: BLUE CROSS/BLUE SHIELD | Admitting: Obstetrics & Gynecology

## 2017-04-10 VITALS — BP 120/80 | Ht 60.0 in | Wt 247.0 lb

## 2017-04-10 DIAGNOSIS — Z3041 Encounter for surveillance of contraceptive pills: Secondary | ICD-10-CM | POA: Diagnosis not present

## 2017-04-10 DIAGNOSIS — E119 Type 2 diabetes mellitus without complications: Secondary | ICD-10-CM

## 2017-04-10 DIAGNOSIS — Z6841 Body Mass Index (BMI) 40.0 and over, adult: Secondary | ICD-10-CM

## 2017-04-10 DIAGNOSIS — Z0000 Encounter for general adult medical examination without abnormal findings: Secondary | ICD-10-CM

## 2017-04-10 MED ORDER — NORETHIN ACE-ETH ESTRAD-FE 1-20 MG-MCG PO TABS: 1.0000 | ORAL_TABLET | Freq: Every day | ORAL | 4 refills | Status: AC

## 2017-04-10 NOTE — Progress Notes (Signed)
HPI:      Alicia Haynes is a 37 y.o. No obstetric history on file. who LMP was Patient's last menstrual period was 03/19/2017 (exact date)., she presents today for her annual examination. The patient has no complaints today. The patient is sexually active. Her last pap: approximate date 2018 and was normal. The patient does perform self breast exams.  There is no notable family history of breast or ovarian cancer in her family.  The patient has regular exercise: yes.  The patient denies current symptoms of depression.    GYN History: Contraception: OCP (estrogen/progesterone)  PMHx: Past Medical History:  Diagnosis Date  . Anxiety   . Diabetes mellitus without complication (HCC)    History reviewed. No pertinent surgical history. Family History  Problem Relation Age of Onset  . Cancer Mother   . Diabetes Mother   . Hypertension Mother    Social History   Tobacco Use  . Smoking status: Never Smoker  . Smokeless tobacco: Never Used  Substance Use Topics  . Alcohol use: No    Alcohol/week: 0.0 oz  . Drug use: No  Marriage last year (same sex)  Current Outpatient Medications:  .  cholecalciferol (VITAMIN D) 1000 units tablet, Take 1 tablet (1,000 Units total) by mouth 3 (three) times a week., Disp: 100 tablet, Rfl: 6 .  glucose blood (ONE TOUCH ULTRA TEST) test strip, 1 each by Other route daily., Disp: 100 each, Rfl: 2 .  metFORMIN (GLUCOPHAGE) 1000 MG tablet, Take 1 tablet (1,000 mg total) by mouth 2 (two) times daily with a meal. Change to this med, Disp: 180 tablet, Rfl: 3 .  norethindrone-ethinyl estradiol (JUNEL FE,GILDESS FE,LOESTRIN FE) 1-20 MG-MCG tablet, Take 1 tablet by mouth daily., Disp: 3 Package, Rfl: 4 .  ONETOUCH DELICA LANCETS FINE MISC, 1 each by Does not apply route daily., Disp: 100 each, Rfl: 2 .  sertraline (ZOLOFT) 50 MG tablet, Take 1 tablet (50 mg total) by mouth daily., Disp: 90 tablet, Rfl: 3 Allergies: Sulfa antibiotics  Review of Systems    Constitutional: Negative for chills, fever and malaise/fatigue.  HENT: Negative for congestion, sinus pain and sore throat.   Eyes: Negative for blurred vision and pain.  Respiratory: Negative for cough and wheezing.   Cardiovascular: Negative for chest pain and leg swelling.  Gastrointestinal: Negative for abdominal pain, constipation, diarrhea, heartburn, nausea and vomiting.  Genitourinary: Negative for dysuria, frequency, hematuria and urgency.  Musculoskeletal: Negative for back pain, joint pain, myalgias and neck pain.  Skin: Negative for itching and rash.  Neurological: Negative for dizziness, tremors and weakness.  Endo/Heme/Allergies: Does not bruise/bleed easily.  Psychiatric/Behavioral: Negative for depression. The patient is not nervous/anxious and does not have insomnia.    Objective: BP 120/80   Ht 5' (1.524 m)   Wt 247 lb (112 kg)   LMP 03/19/2017 (Exact Date)   BMI 48.24 kg/m   Filed Weights   04/10/17 0809  Weight: 247 lb (112 kg)   Body mass index is 48.24 kg/m. Physical Exam  Constitutional: She is oriented to person, place, and time. She appears well-developed and well-nourished. No distress.  Obesity noted, somewhat limits pelvic exam  Genitourinary: Rectum normal, vagina normal and uterus normal. Pelvic exam was performed with patient supine. There is no rash or lesion on the right labia. There is no rash or lesion on the left labia. Vagina exhibits no lesion. No bleeding in the vagina. Right adnexum does not display mass and does not  display tenderness. Left adnexum does not display mass and does not display tenderness. Cervix does not exhibit motion tenderness, lesion, friability or polyp.   Uterus is mobile and midaxial. Uterus is not enlarged or exhibiting a mass.  HENT:  Head: Normocephalic and atraumatic. Head is without laceration.  Right Ear: Hearing normal.  Left Ear: Hearing normal.  Nose: No epistaxis.  No foreign bodies.  Mouth/Throat: Uvula is  midline, oropharynx is clear and moist and mucous membranes are normal.  Eyes: Pupils are equal, round, and reactive to light.  Neck: Normal range of motion. Neck supple. No thyromegaly present.  Cardiovascular: Normal rate and regular rhythm. Exam reveals no gallop and no friction rub.  No murmur heard. Pulmonary/Chest: Effort normal and breath sounds normal. No respiratory distress. She has no wheezes. Right breast exhibits no mass, no skin change and no tenderness. Left breast exhibits no mass, no skin change and no tenderness.  Abdominal: Soft. Bowel sounds are normal. She exhibits no distension. There is no tenderness. There is no rebound.  Musculoskeletal: Normal range of motion.  Neurological: She is alert and oriented to person, place, and time. No cranial nerve deficit.  Skin: Skin is warm and dry.  Psychiatric: She has a normal mood and affect. Judgment normal.  Vitals reviewed.  Assessment: 1. Annual physical exam   2. Type 2 diabetes mellitus without complication, without long-term current use of insulin (HCC)   3. Encounter for surveillance of contraceptive pills   4. Class 3 severe obesity due to excess calories with serious comorbidity and body mass index (BMI) of 40.0 to 44.9 in adult [facility]) Chronic   Plan:            1.  Cervical Screening-  Pap smear schedule reviewed with patient  2. Labs managed by PCP  3. Counseling for contraception: oral contraceptives (estrogen/progesterone)    4. Type 2 diabetes mellitus without complication, without long-term current use of insulin (HCC).  PCP managing.  5. Encounter for surveillance of contraceptive pills Cont this plan of mgt   6. Obesity Encouraged diet, exercise, lifestyle changes    F/U  Return in about 1 year (around 04/11/2018) for Annual.  Annamarie Major, MD, [facility], [facility] 04/10/2017  8:12 AM

## 2017-04-10 NOTE — Patient Instructions (Signed)
PAP every three years Labs yearly (with PCP)   

## 2017-04-21 ENCOUNTER — Ambulatory Visit: Payer: BLUE CROSS/BLUE SHIELD | Admitting: Family Medicine

## 2017-05-26 LAB — HM DIABETES EYE EXAM

## 2017-06-20 ENCOUNTER — Other Ambulatory Visit: Payer: Self-pay

## 2017-08-13 ENCOUNTER — Ambulatory Visit: Payer: BLUE CROSS/BLUE SHIELD | Admitting: Family Medicine

## 2017-08-13 ENCOUNTER — Ambulatory Visit
Admission: RE | Admit: 2017-08-13 | Discharge: 2017-08-13 | Disposition: A | Discharge status: Home / Self Care | Payer: BLUE CROSS/BLUE SHIELD | Source: Ambulatory Visit | Attending: Family Medicine | Admitting: Family Medicine

## 2017-08-13 ENCOUNTER — Encounter: Payer: Self-pay | Admitting: Family Medicine

## 2017-08-13 VITALS — BP 126/82 | HR 93 | Resp 16 | Ht 60.0 in | Wt 246.0 lb

## 2017-08-13 DIAGNOSIS — R1033 Periumbilical pain: Secondary | ICD-10-CM

## 2017-08-13 DIAGNOSIS — R109 Unspecified abdominal pain: Secondary | ICD-10-CM | POA: Diagnosis not present

## 2017-08-13 MED ORDER — RANITIDINE HCL 150 MG PO TABS: 150.0000 mg | ORAL_TABLET | Freq: Two times a day (BID) | ORAL | 1 refills | Status: DC

## 2017-08-13 NOTE — Progress Notes (Signed)
Name: Baltazar Najjarivea Decole Tarbell   MRN: [id number]    DOB: 12-Apr-1980   Date:08/13/2017       Progress Note  Subjective  Chief Complaint  Chief Complaint  Patient presents with  . Diarrhea    Started Fri night and has gotten better but feels full and abdominal pressure.     Abdominal Pain  This is a new problem. The current episode started in the past 7 days (FRiday). The onset quality is gradual. The problem occurs constantly. The problem has been waxing and waning. The pain is located in the periumbilical region. The pain is mild. The quality of the pain is a sensation of fullness. The abdominal pain does not radiate. Associated symptoms include diarrhea and nausea. Pertinent negatives include no anorexia, arthralgias, belching, constipation, dysuria, fever, flatus, frequency, headaches, hematochezia, hematuria, melena, myalgias, vomiting or weight loss. The pain is aggravated by eating. The pain is relieved by nothing. Treatments tried: pepto bismol. The treatment provided no relief. There is no history of abdominal surgery, colon cancer, Crohn's disease, gallstones, GERD, irritable bowel syndrome, pancreatitis, PUD or ulcerative colitis.  Diabetes  She presents for her follow-up diabetic visit. She has type 2 diabetes mellitus. Her disease course has been stable. Pertinent negatives for hypoglycemia include no confusion, dizziness, headaches, hunger, mood changes, nervousness/anxiousness, pallor, seizures, sleepiness, speech difficulty, sweats or tremors. Pertinent negatives for diabetes include no blurred vision, no chest pain, no fatigue, no foot paresthesias, no foot ulcerations, no polydipsia, no polyphagia, no polyuria, no visual change, no weakness and no weight loss. There are no hypoglycemic complications. Symptoms are worsening. There are no diabetic complications. Current diabetic treatment includes oral agent (monotherapy). Her breakfast blood glucose is taken between 8-9 am. Her breakfast blood  glucose range is generally 110-130 mg/dl.    No problem-specific Assessment & Plan notes found for this encounter.   Past Medical History:  Diagnosis Date  . Anxiety   . Diabetes mellitus without complication (HCC)     History reviewed. No pertinent surgical history.  Family History  Problem Relation Age of Onset  . Cancer Mother   . Diabetes Mother   . Hypertension Mother     Social History   Socioeconomic History  . Marital status: Married    Spouse name: Not on file  . Number of children: Not on file  . Years of education: Not on file  . Highest education level: Not on file  Occupational History  . Not on file  Social Needs  . Financial resource strain: Not on file  . Food insecurity:    Worry: Not on file    Inability: Not on file  . Transportation needs:    Medical: Not on file    Non-medical: Not on file  Tobacco Use  . Smoking status: Never Smoker  . Smokeless tobacco: Never Used  Substance and Sexual Activity  . Alcohol use: No    Alcohol/week: 0.0 oz  . Drug use: No  . Sexual activity: Not on file  Lifestyle  . Physical activity:    Days per week: Not on file    Minutes per session: Not on file  . Stress: Not on file  Relationships  . Social connections:    Talks on phone: Not on file    Gets together: Not on file    Attends religious service: Not on file    Active member of club or organization: Not on file    Attends meetings of clubs or organizations: Not  on file    Relationship status: Not on file  . Intimate partner violence:    Fear of current or ex partner: Not on file    Emotionally abused: Not on file    Physically abused: Not on file    Forced sexual activity: Not on file  Other Topics Concern  . Not on file  Social History Narrative  . Not on file    Allergies  Allergen Reactions  . Sulfa Antibiotics Swelling    Outpatient Medications Prior to Visit  Medication Sig Dispense Refill  . cholecalciferol (VITAMIN D) 1000  units tablet Take 1 tablet (1,000 Units total) by mouth 3 (three) times a week. 100 tablet 6  . glucose blood (ONE TOUCH ULTRA TEST) test strip 1 each by Other route daily. 100 each 2  . metFORMIN (GLUCOPHAGE) 1000 MG tablet Take 1 tablet (1,000 mg total) by mouth 2 (two) times daily with a meal. Change to this med 180 tablet 3  . norethindrone-ethinyl estradiol (JUNEL FE,GILDESS FE,LOESTRIN FE) 1-20 MG-MCG tablet Take 1 tablet by mouth daily. 3 Package 4  . ONETOUCH DELICA LANCETS FINE MISC 1 each by Does not apply route daily. 100 each 2  . sertraline (ZOLOFT) 50 MG tablet Take 1 tablet (50 mg total) by mouth daily. 90 tablet 3   No facility-administered medications prior to visit.     Review of Systems  Constitutional: Negative for chills, fatigue, fever, malaise/fatigue and weight loss.  HENT: Negative for ear discharge, ear pain and sore throat.   Eyes: Negative for blurred vision.  Respiratory: Negative for cough, sputum production, shortness of breath and wheezing.   Cardiovascular: Negative for chest pain, palpitations and leg swelling.  Gastrointestinal: Positive for abdominal pain, diarrhea and nausea. Negative for anorexia, blood in stool, constipation, flatus, heartburn, hematochezia, melena and vomiting.  Genitourinary: Negative for dysuria, frequency, hematuria and urgency.  Musculoskeletal: Negative for arthralgias, back pain, joint pain, myalgias and neck pain.  Skin: Negative for pallor and rash.  Neurological: Negative for dizziness, tingling, tremors, sensory change, focal weakness, seizures, speech difficulty, weakness and headaches.  Endo/Heme/Allergies: Negative for environmental allergies, polydipsia and polyphagia. Does not bruise/bleed easily.  Psychiatric/Behavioral: Negative for confusion, depression and suicidal ideas. The patient is not nervous/anxious and does not have insomnia.      Objective  Vitals:   08/13/17 1330  BP: 126/82  Pulse: 93  Resp: 16   SpO2: 98%  Weight: 246 lb (111.6 kg)  Height: 5' (1.524 m)    Physical Exam  Constitutional: She is oriented to person, place, and time. She appears well-developed and well-nourished.  HENT:  Head: Normocephalic.  Right Ear: External ear normal.  Left Ear: External ear normal.  Mouth/Throat: Oropharynx is clear and moist.  Eyes: Pupils are equal, round, and reactive to light. Conjunctivae and EOM are normal. Lids are everted and swept, no foreign bodies found. Left eye exhibits no hordeolum. No foreign body present in the left eye. Right conjunctiva is not injected. Left conjunctiva is not injected. No scleral icterus.  Neck: Normal range of motion. Neck supple. No JVD present. No tracheal deviation present. No thyromegaly present.  Cardiovascular: Normal rate, regular rhythm, normal heart sounds and intact distal pulses. Exam reveals no gallop and no friction rub.  No murmur heard. Pulmonary/Chest: Effort normal and breath sounds normal. No respiratory distress. She has no wheezes. She has no rales.  Abdominal: Soft. Bowel sounds are normal. She exhibits no distension, no pulsatile liver, no ascites and  no mass. There is no hepatosplenomegaly. There is no tenderness. There is no rebound and no guarding.  Musculoskeletal: Normal range of motion. She exhibits no edema or tenderness.  Lymphadenopathy:    She has no cervical adenopathy.  Neurological: She is alert and oriented to person, place, and time. She has normal strength. She displays normal reflexes. No cranial nerve deficit.  Skin: Skin is warm. No rash noted.  Psychiatric: She has a normal mood and affect. Her mood appears not anxious. She does not exhibit a depressed mood.  Nursing note and vitals reviewed.     Assessment & Plan  Problem List Items Addressed This Visit    None    Visit Diagnoses    Periumbilical abdominal pain    -  Primary   Nonspecific abdominal pain. Will obtain abdominal views and cbc. Initiate  ranitidine 150 mg bid.   Relevant Orders   DG Abd 2 Views   CBC with Differential/Platelet      Meds ordered this encounter  Medications  . ranitidine (ZANTAC) 150 MG tablet    Sig: Take 1 tablet (150 mg total) by mouth 2 (two) times daily.    Dispense:  60 tablet    Refill:  1      Dr. Elizabeth Sauer [facility]  08/13/17

## 2017-08-14 LAB — CBC WITH DIFFERENTIAL/PLATELET
BASOS ABS: 0 10*3/uL (ref 0.0–0.2)
Basos: 0 %
EOS (ABSOLUTE): 0.2 10*3/uL (ref 0.0–0.4)
Eos: 2 %
Hematocrit: 37.7 % (ref 34.0–46.6)
Hemoglobin: 12.3 g/dL (ref 11.1–15.9)
Immature Grans (Abs): 0 10*3/uL (ref 0.0–0.1)
Immature Granulocytes: 0 %
LYMPHS ABS: 4.4 10*3/uL — AB (ref 0.7–3.1)
LYMPHS: 30 %
MCH: 27.8 pg (ref 26.6–33.0)
MCHC: 32.6 g/dL (ref 31.5–35.7)
MCV: 85 fL (ref 79–97)
Monocytes Absolute: 0.6 10*3/uL (ref 0.1–0.9)
Monocytes: 4 %
NEUTROS ABS: 9.4 10*3/uL — AB (ref 1.4–7.0)
Neutrophils: 64 %
PLATELETS: 360 10*3/uL (ref 150–450)
RBC: 4.42 x10E6/uL (ref 3.77–5.28)
RDW: 14.2 % (ref 12.3–15.4)
WBC: 14.7 10*3/uL — AB (ref 3.4–10.8)

## 2017-08-15 ENCOUNTER — Other Ambulatory Visit: Payer: Self-pay

## 2017-08-15 DIAGNOSIS — K5792 Diverticulitis of intestine, part unspecified, without perforation or abscess without bleeding: Secondary | ICD-10-CM

## 2017-08-15 MED ORDER — AMOXICILLIN-POT CLAVULANATE 875-125 MG PO TABS: 1.0000 | ORAL_TABLET | Freq: Two times a day (BID) | ORAL | 0 refills | Status: DC

## 2017-08-15 MED ORDER — METRONIDAZOLE 500 MG PO TABS: 500.0000 mg | ORAL_TABLET | Freq: Four times a day (QID) | ORAL | 0 refills | Status: DC

## 2017-08-28 ENCOUNTER — Other Ambulatory Visit: Payer: Self-pay

## 2017-08-28 MED ORDER — FLUCONAZOLE 150 MG PO TABS: 150.0000 mg | ORAL_TABLET | Freq: Once | ORAL | 0 refills | Status: AC

## 2017-09-19 ENCOUNTER — Ambulatory Visit: Payer: BLUE CROSS/BLUE SHIELD | Admitting: Family Medicine

## 2017-10-06 ENCOUNTER — Other Ambulatory Visit: Payer: Self-pay | Admitting: Family Medicine

## 2017-10-17 ENCOUNTER — Ambulatory Visit: Payer: BLUE CROSS/BLUE SHIELD | Admitting: Family Medicine

## 2017-10-21 ENCOUNTER — Ambulatory Visit: Payer: BLUE CROSS/BLUE SHIELD | Admitting: Family Medicine

## 2017-11-07 ENCOUNTER — Ambulatory Visit: Payer: BLUE CROSS/BLUE SHIELD | Admitting: Family Medicine

## 2017-11-13 ENCOUNTER — Encounter: Payer: Self-pay | Admitting: Family Medicine

## 2017-11-13 ENCOUNTER — Ambulatory Visit: Payer: BLUE CROSS/BLUE SHIELD | Admitting: Family Medicine

## 2017-11-13 VITALS — BP 120/82 | HR 72 | Ht 60.0 in | Wt 240.0 lb

## 2017-11-13 DIAGNOSIS — F419 Anxiety disorder, unspecified: Secondary | ICD-10-CM

## 2017-11-13 DIAGNOSIS — Z23 Encounter for immunization: Secondary | ICD-10-CM | POA: Diagnosis not present

## 2017-11-13 DIAGNOSIS — E119 Type 2 diabetes mellitus without complications: Secondary | ICD-10-CM

## 2017-11-13 DIAGNOSIS — K219 Gastro-esophageal reflux disease without esophagitis: Secondary | ICD-10-CM | POA: Diagnosis not present

## 2017-11-13 MED ORDER — ONETOUCH DELICA LANCETS FINE MISC: 1.0000 | Freq: Every day | 2 refills | Status: AC

## 2017-11-13 MED ORDER — SERTRALINE HCL 50 MG PO TABS: 50.0000 mg | ORAL_TABLET | Freq: Every day | ORAL | 1 refills | Status: AC

## 2017-11-13 MED ORDER — RANITIDINE HCL 150 MG PO TABS: 150.0000 mg | ORAL_TABLET | Freq: Two times a day (BID) | ORAL | 1 refills | Status: AC

## 2017-11-13 MED ORDER — RANITIDINE HCL 150 MG PO TABS: 150.0000 mg | ORAL_TABLET | Freq: Two times a day (BID) | ORAL | 1 refills | Status: DC

## 2017-11-13 MED ORDER — GLUCOSE BLOOD VI STRP: 1.0000 | ORAL_STRIP | Freq: Every day | 2 refills | Status: AC

## 2017-11-13 MED ORDER — METFORMIN HCL 1000 MG PO TABS: 1000.0000 mg | ORAL_TABLET | Freq: Two times a day (BID) | ORAL | 1 refills | Status: AC

## 2017-11-13 NOTE — Progress Notes (Signed)
Date:  11/13/2017   Name:  Alicia Haynes   DOB:  1980/01/11   MRN:  [id number]   Chief Complaint: Diabetes; Depression; and Gastroesophageal Reflux Diabetes  She presents for her follow-up diabetic visit. She has type 2 diabetes mellitus. Her disease course has been stable. There are no hypoglycemic associated symptoms. Pertinent negatives for hypoglycemia include no dizziness, headaches or nervousness/anxiousness. Pertinent negatives for diabetes include no blurred vision, no chest pain, no fatigue, no foot paresthesias, no foot ulcerations, no polydipsia, no polyphagia, no polyuria, no visual change, no weakness and no weight loss. There are no hypoglycemic complications. Symptoms are stable. There are no diabetic complications. There are no known risk factors for coronary artery disease. Current diabetic treatment includes oral agent (monotherapy). She is compliant with treatment all of the time. Her weight is stable. She is following a generally healthy diet. Meal planning includes avoidance of concentrated sweets and carbohydrate counting. She participates in exercise daily. Her home blood glucose trend is fluctuating minimally. Her breakfast blood glucose is taken between 8-9 am. Her breakfast blood glucose range is generally 110-130 mg/dl. An ACE inhibitor/angiotensin II receptor blocker is not being taken. Eye exam is current.  Depression         This is a chronic problem.  The current episode started more than 1 year ago.   The onset quality is sudden.   The problem occurs intermittently.  The problem has been gradually improving since onset.  Associated symptoms include no decreased concentration, no fatigue, no helplessness, no hopelessness, does not have insomnia, not irritable, no restlessness, no decreased interest, no appetite change, no body aches, no myalgias, no headaches, no indigestion, not sad and no suicidal ideas.     The symptoms are aggravated by nothing.  Past treatments  include SSRIs - Selective serotonin reuptake inhibitors.  Compliance with treatment is good.  Previous treatment provided moderate relief.   Pertinent negatives include no chronic fatigue syndrome, no eating disorder and no mental health disorder. Gastroesophageal Reflux  She reports no abdominal pain, no belching, no chest pain, no choking, no coughing, no dysphagia, no early satiety, no globus sensation, no heartburn, no hoarse voice, no nausea, no sore throat, no stridor, no tooth decay, no water brash or no wheezing. This is a chronic problem. The current episode started more than 1 year ago. The problem occurs occasionally. The problem has been waxing and waning. The symptoms are aggravated by certain foods. Pertinent negatives include no anemia, fatigue, melena, muscle weakness, orthopnea or weight loss. There are no known risk factors. She has tried a histamine-2 antagonist for the symptoms. The treatment provided moderate relief.     Review of Systems  Constitutional: Negative.  Negative for appetite change, chills, fatigue, fever, unexpected weight change and weight loss.  HENT: Negative for congestion, ear discharge, ear pain, hoarse voice, rhinorrhea, sinus pressure, sneezing and sore throat.   Eyes: Negative for blurred vision, photophobia, pain, discharge, redness and itching.  Respiratory: Negative for cough, choking, shortness of breath, wheezing and stridor.   Cardiovascular: Negative for chest pain.  Gastrointestinal: Negative for abdominal pain, blood in stool, constipation, diarrhea, dysphagia, heartburn, melena, nausea and vomiting.  Endocrine: Negative for cold intolerance, heat intolerance, polydipsia, polyphagia and polyuria.  Genitourinary: Negative for dysuria, flank pain, frequency, hematuria, menstrual problem, pelvic pain, urgency, vaginal bleeding and vaginal discharge.  Musculoskeletal: Negative for arthralgias, back pain, myalgias and muscle weakness.  Skin: Negative  for rash.  Allergic/Immunologic: Negative for  environmental allergies and food allergies.  Neurological: Negative for dizziness, weakness, light-headedness, numbness and headaches.  Hematological: Negative for adenopathy. Does not bruise/bleed easily.  Psychiatric/Behavioral: Positive for depression. Negative for decreased concentration, dysphoric mood and suicidal ideas. The patient is not nervous/anxious and does not have insomnia.     Patient Active Problem List   Diagnosis Date Noted  . Class 3 severe obesity due to excess calories with serious comorbidity and body mass index (BMI) of 40.0 to 44.9 in adult (HCC) 04/10/2017  . Chronic anxiety 02/21/2016  . Vitamin D deficiency 02/21/2016  . Type 2 diabetes mellitus without complication, without long-term current use of insulin (HCC) 01/25/2015  . Acid reflux 06/15/2014    Allergies  Allergen Reactions  . Sulfa Antibiotics Swelling    History reviewed. No pertinent surgical history.  Social History   Tobacco Use  . Smoking status: Never Smoker  . Smokeless tobacco: Never Used  Substance Use Topics  . Alcohol use: No    Alcohol/week: 0.0 standard drinks  . Drug use: No     Medication list has been reviewed and updated.  Current Meds  Medication Sig  . cholecalciferol (VITAMIN D) 1000 units tablet Take 1 tablet (1,000 Units total) by mouth 3 (three) times a week.  Marland Kitchen glucose blood (ONE TOUCH ULTRA TEST) test strip 1 each by Other route daily.  . metFORMIN (GLUCOPHAGE) 1000 MG tablet Take 1 tablet (1,000 mg total) by mouth 2 (two) times daily with a meal. Change to this med  . norethindrone-ethinyl estradiol (JUNEL FE,GILDESS FE,LOESTRIN FE) 1-20 MG-MCG tablet Take 1 tablet by mouth daily.  Letta Pate DELICA LANCETS FINE MISC 1 each by Does not apply route daily.  . ranitidine (ZANTAC) 150 MG tablet Take 1 tablet (150 mg total) by mouth 2 (two) times daily.  . sertraline (ZOLOFT) 50 MG tablet Take 1 tablet (50 mg total) by  mouth daily.  . [DISCONTINUED] glucose blood (ONE TOUCH ULTRA TEST) test strip 1 each by Other route daily.  . [DISCONTINUED] metFORMIN (GLUCOPHAGE) 1000 MG tablet Take 1 tablet (1,000 mg total) by mouth 2 (two) times daily with a meal. Change to this med  . [DISCONTINUED] ONETOUCH DELICA LANCETS FINE MISC 1 each by Does not apply route daily.  . [DISCONTINUED] ranitidine (ZANTAC) 150 MG tablet TAKE 1 TABLET BY MOUTH TWICE A DAY  . [DISCONTINUED] ranitidine (ZANTAC) 150 MG tablet Take 1 tablet (150 mg total) by mouth 2 (two) times daily.  . [DISCONTINUED] sertraline (ZOLOFT) 50 MG tablet Take 1 tablet (50 mg total) by mouth daily.    PHQ 2/9 Scores 11/13/2017 03/19/2017 10/21/2016 10/21/2016  PHQ - 2 Score 0 0 1 1  PHQ- 9 Score 0 0 1 -    Physical Exam  Constitutional: She is oriented to person, place, and time. She appears well-developed and well-nourished. She is not irritable.  HENT:  Head: Normocephalic.  Right Ear: Hearing, tympanic membrane, external ear and ear canal normal.  Left Ear: Hearing, tympanic membrane, external ear and ear canal normal.  Nose: Nose normal.  Mouth/Throat: Uvula is midline, oropharynx is clear and moist and mucous membranes are normal. Mucous membranes are not pale. No oropharyngeal exudate, posterior oropharyngeal edema or posterior oropharyngeal erythema.  Eyes: Pupils are equal, round, and reactive to light. Conjunctivae and EOM are normal. Lids are everted and swept, no foreign bodies found. Left eye exhibits no hordeolum. No foreign body present in the left eye. Right conjunctiva is not injected. Left conjunctiva is  not injected. No scleral icterus.  Neck: Normal range of motion. Neck supple. No JVD present. No tracheal deviation present. No thyromegaly present.  Cardiovascular: Normal rate, regular rhythm, S1 normal, S2 normal, normal heart sounds and intact distal pulses. PMI is not displaced. Exam reveals no gallop, no S3, no S4, no distant heart sounds  and no friction rub.  No murmur heard.  No systolic murmur is present.  No diastolic murmur is present. Pulses:      Carotid pulses are 2+ on the right side, and 2+ on the left side.      Radial pulses are 2+ on the right side, and 2+ on the left side.       Femoral pulses are 2+ on the right side, and 2+ on the left side.      Popliteal pulses are 2+ on the right side, and 2+ on the left side.       Dorsalis pedis pulses are 2+ on the right side, and 2+ on the left side.       Posterior tibial pulses are 2+ on the right side, and 2+ on the left side.  Pulmonary/Chest: Effort normal and breath sounds normal. No respiratory distress. She has no wheezes. She has no rales.  Abdominal: Soft. Bowel sounds are normal. She exhibits no mass. There is no hepatosplenomegaly. There is no tenderness. There is no rebound and no guarding.  Musculoskeletal: Normal range of motion. She exhibits no edema or tenderness.  Feet:  Right Foot:  Protective Sensation: 10 sites tested. 10 sites sensed.  Skin Integrity: Negative for ulcer, blister, skin breakdown, erythema, warmth, callus or dry skin.  Left Foot:  Protective Sensation: 10 sites tested. 10 sites sensed.  Skin Integrity: Negative for ulcer, blister, skin breakdown, erythema, warmth, callus or dry skin.  Lymphadenopathy:    She has no cervical adenopathy.  Neurological: She is alert and oriented to person, place, and time. She has normal strength. She displays normal reflexes. No cranial nerve deficit.  Skin: Skin is warm. No rash noted.  Psychiatric: She has a normal mood and affect. Her mood appears not anxious. She does not exhibit a depressed mood.  Nursing note and vitals reviewed.   BP 120/82   Pulse 72   Ht 5' (1.524 m)   Wt 240 lb (108.9 kg)   LMP 10/21/2017 (Approximate)   BMI 46.87 kg/m   Assessment and Plan:  1. Influenza vaccine needed administered - Flu Vaccine QUAD 36+ mos IM  2. Chronic anxiety Stable on med- refill  sertraline - sertraline (ZOLOFT) 50 MG tablet; Take 1 tablet (50 mg total) by mouth daily.  Dispense: 90 tablet; Refill: 1  3. Type 2 diabetes mellitus without complication, without long-term current use of insulin (HCC) Stable on med- refill Metformin/ draw A1C and microalbumin collected - Hemoglobin A1c - Microalbumin / creatinine urine ratio - metFORMIN (GLUCOPHAGE) 1000 MG tablet; Take 1 tablet (1,000 mg total) by mouth 2 (two) times daily with a meal. Change to this med  Dispense: 180 tablet; Refill: 1 - ONETOUCH DELICA LANCETS FINE MISC; 1 each by Does not apply route daily.  Dispense: 100 each; Refill: 2 - glucose blood (ONE TOUCH ULTRA TEST) test strip; 1 each by Other route daily.  Dispense: 100 each; Refill: 2  4. Gastroesophageal reflux disease, esophagitis presence not specified Stable on med- refill Ranitidine - ranitidine (ZANTAC) 150 MG tablet; Take 1 tablet (150 mg total) by mouth 2 (two) times daily.  Dispense: 180  tablet; Refill: 1   Dr. Hayden [facility]  11/13/2017

## 2017-11-14 LAB — HEMOGLOBIN A1C
Est. average glucose Bld gHb Est-mCnc: 143 mg/dL
HEMOGLOBIN A1C: 6.6 % — AB (ref 4.8–5.6)

## 2017-11-14 LAB — MICROALBUMIN / CREATININE URINE RATIO
Creatinine, Urine: 292.4 mg/dL
Microalb/Creat Ratio: 5.5 mg/g creat (ref 0.0–30.0)
Microalbumin, Urine: 16.1 ug/mL

## 2018-02-10 ENCOUNTER — Encounter: Payer: Self-pay | Admitting: Family Medicine

## 2018-02-10 ENCOUNTER — Ambulatory Visit: Payer: BLUE CROSS/BLUE SHIELD | Admitting: Family Medicine

## 2018-02-10 VITALS — BP 100/70 | HR 76 | Temp 98.9°F | Ht 60.0 in | Wt 247.0 lb

## 2018-02-10 DIAGNOSIS — J111 Influenza due to unidentified influenza virus with other respiratory manifestations: Secondary | ICD-10-CM | POA: Diagnosis not present

## 2018-02-10 LAB — POCT INFLUENZA A/B
Influenza A, POC: NEGATIVE
Influenza B, POC: NEGATIVE

## 2018-02-10 MED ORDER — OSELTAMIVIR PHOSPHATE 75 MG PO CAPS: 75.0000 mg | ORAL_CAPSULE | Freq: Two times a day (BID) | ORAL | 0 refills | Status: AC

## 2018-02-10 NOTE — Progress Notes (Signed)
Date:  02/10/2018   Name:  Alicia Haynes   DOB:  September 03, 1980   MRN:  [id number]   Chief Complaint: Fever (cong, fever, chills, cough, body aches- started Saturday)  Fever   This is a new problem. The current episode started in the past 7 days (saturday). The problem occurs daily. The problem has been gradually improving. Her temperature was unmeasured prior to arrival. Associated symptoms include congestion, coughing, muscle aches, a sore throat and wheezing. Pertinent negatives include no abdominal pain, chest pain, diarrhea, ear pain, headaches, nausea, rash, sleepiness, urinary pain or vomiting. She has tried acetaminophen (nyquil/theruflu) for the symptoms. The treatment provided no relief.  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 Negative for chills, fatigue and unexpected weight change.  HENT: Positive for congestion and sore throat. Negative for ear discharge, ear pain, rhinorrhea, sinus pressure and sneezing.   Eyes: Negative for photophobia, pain, discharge, redness and itching.  Respiratory: Positive for cough and wheezing. Negative for shortness of breath and stridor.   Cardiovascular: Negative for chest pain.  Gastrointestinal: Negative for abdominal pain, blood in stool, constipation, diarrhea, nausea and vomiting.  Endocrine: Negative for cold intolerance, heat intolerance, polydipsia, polyphagia and polyuria.  Genitourinary: Negative for dysuria, flank pain, frequency, hematuria, menstrual problem, pelvic pain, urgency, vaginal bleeding and vaginal discharge.  Musculoskeletal: Negative for arthralgias, back pain and myalgias.  Skin: Negative for rash.  Allergic/Immunologic: Negative for environmental allergies and food  allergies.  Neurological: Negative for dizziness, weakness, light-headedness, numbness and headaches.  Hematological: Negative for adenopathy. Does not bruise/bleed easily.  Psychiatric/Behavioral: Negative for dysphoric mood. The patient is not nervous/anxious.     Patient Active Problem List   Diagnosis Date Noted  . Class 3 severe obesity due to excess calories with serious comorbidity and body mass index (BMI) of 40.0 to 44.9 in adult (HCC) 04/10/2017  . Chronic anxiety 02/21/2016  . Vitamin D deficiency 02/21/2016  . Type 2 diabetes mellitus without complication, without long-term current use of insulin (HCC) 01/25/2015  . Acid reflux 06/15/2014    Allergies  Allergen Reactions  . Sulfa Antibiotics Swelling    No past surgical history on file.  Social History   Tobacco Use  . Smoking status: Never Smoker  . Smokeless tobacco: Never Used  Substance Use Topics  . Alcohol use: No    Alcohol/week: 0.0 standard drinks  . Drug use: No     Medication list has been reviewed and updated.  Current Meds  Medication Sig  . cholecalciferol (VITAMIN D) 1000 units tablet Take 1 tablet (1,000 Units total) by mouth 3 (three) times a week.  Marland Kitchen glucose blood (ONE TOUCH ULTRA TEST) test strip 1 each by Other route daily.  . metFORMIN (GLUCOPHAGE) 1000 MG tablet Take 1 tablet (1,000 mg total) by mouth 2 (two) times daily with a meal. Change to this med  . norethindrone-ethinyl estradiol (JUNEL FE,GILDESS FE,LOESTRIN FE) 1-20 MG-MCG tablet Take 1 tablet by mouth daily.  Letta Pate DELICA LANCETS FINE MISC 1 each by Does not apply route daily.  . ranitidine (ZANTAC) 150 MG tablet Take 1 tablet (150 mg total) by mouth 2 (two) times daily.  . sertraline (ZOLOFT) 50 MG tablet Take 1 tablet (50 mg total) by mouth  daily.    PHQ 2/9 Scores 02/10/2018 11/13/2017 03/19/2017 10/21/2016  PHQ - 2 Score 0 0 0 1  PHQ- 9 Score 0 0 0 1    Physical Exam Vitals signs and nursing note reviewed.    Constitutional:      General: She is not in acute distress.    Appearance: She is not diaphoretic.  HENT:     Head: Normocephalic and atraumatic.     Jaw: There is normal jaw occlusion.     Right Ear: Hearing, tympanic membrane, ear canal and external ear normal.     Left Ear: Hearing, tympanic membrane, ear canal and external ear normal.     Nose: Nose normal.     Right Turbinates: Swollen.     Left Turbinates: Swollen.     Right Sinus: No maxillary sinus tenderness or frontal sinus tenderness.     Left Sinus: No maxillary sinus tenderness or frontal sinus tenderness.  Eyes:     General:        Right eye: No discharge.        Left eye: No discharge.     Conjunctiva/sclera: Conjunctivae normal.     Pupils: Pupils are equal, round, and reactive to light.  Neck:     Musculoskeletal: Full passive range of motion without pain, normal range of motion and neck supple.     Thyroid: No thyroid mass, thyromegaly or thyroid tenderness.     Vascular: No JVD.  Cardiovascular:     Rate and Rhythm: Normal rate and regular rhythm.     Heart sounds: Normal heart sounds, S1 normal and S2 normal. No murmur. No systolic murmur. No diastolic murmur. No friction rub. No gallop. No S3 or S4 sounds.   Pulmonary:     Effort: Pulmonary effort is normal.     Breath sounds: Normal breath sounds. No decreased breath sounds, wheezing, rhonchi or rales.  Abdominal:     General: Bowel sounds are normal.     Palpations: Abdomen is soft. There is no hepatomegaly, splenomegaly or mass.     Tenderness: There is no abdominal tenderness. There is no guarding or rebound.  Musculoskeletal: Normal range of motion.  Lymphadenopathy:     Head:     Right side of head: No submandibular adenopathy.     Left side of head: No submandibular adenopathy.     Cervical: No cervical adenopathy.     Right cervical: No superficial or deep cervical adenopathy.    Left cervical: No superficial or deep cervical adenopathy.   Skin:    General: Skin is warm and dry.     Capillary Refill: Capillary refill takes less than 2 seconds.  Neurological:     Mental Status: She is alert.     Deep Tendon Reflexes: Reflexes are normal and symmetric.     BP 100/70   Pulse 76   Temp 98.9 F (37.2 C) (Oral)   Ht 5' (1.524 m)   Wt 247 lb (112 kg)   LMP 02/02/2018 (Approximate)   BMI 48.24 kg/m   Assessment and Plan: 1. Influenza Acute. Onset two days. 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 Obtained influenza A and B- both are negative. Will still treat due to employment status and fever/ symptoms. Start Tamiflu - oseltamivir (TAMIFLU) 75 MG capsule; Take 1 capsule (75 mg total) by mouth 2 (two) times daily.  Dispense: 10 capsule; Refill: 0 - POCT Influenza A/B

## 2018-02-12 ENCOUNTER — Other Ambulatory Visit: Payer: Self-pay

## 2018-02-12 DIAGNOSIS — B001 Herpesviral vesicular dermatitis: Secondary | ICD-10-CM

## 2018-02-12 MED ORDER — VALACYCLOVIR HCL 1 G PO TABS: 2000.0000 mg | ORAL_TABLET | Freq: Two times a day (BID) | ORAL | 0 refills | Status: AC

## 2018-02-12 NOTE — Progress Notes (Unsigned)
Sent in RX for cold sores

## 2018-05-09 ENCOUNTER — Other Ambulatory Visit: Payer: Self-pay | Admitting: Family Medicine

## 2018-05-09 DIAGNOSIS — K219 Gastro-esophageal reflux disease without esophagitis: Secondary | ICD-10-CM

## 2018-08-13 ENCOUNTER — Telehealth: Payer: Self-pay

## 2018-08-13 NOTE — Telephone Encounter (Signed)
This patient has moved away.

## 2018-08-13 NOTE — Telephone Encounter (Signed)
Patient overdue for diabetic/ gerd follow up. Please call and schedule in the next 30 days.  Thankyou.

## 2019-10-18 ENCOUNTER — Telehealth
Admit: 2019-10-18 | Discharge: 2019-10-18 | Payer: PRIVATE HEALTH INSURANCE | Attending: Family Medicine | Primary: Family Medicine

## 2019-10-18 ENCOUNTER — Telehealth: Attending: Family Medicine | Primary: Family Medicine

## 2019-10-18 DIAGNOSIS — R0683 Snoring: Secondary | ICD-10-CM

## 2019-10-18 NOTE — Assessment & Plan Note (Signed)
Reports last A1c 6.4 in June 2021  On Metformin  Follow-up in the next 2 months for diabetes

## 2019-10-18 NOTE — Progress Notes (Signed)
 Progress Note    she is a 39 y.o. year old female who presents for evalution.      Assessment/ Plan:   Diagnoses and all orders for this visit:    1. Snoring  -     SLEEP MEDICINE REFERRAL    2. Observed sleep apnea  -     SLEEP MEDICINE REFERRAL    3. Controlled type 2 diabetes mellitus without complication, without long-term current use of insulin (HCC)  Assessment & Plan:  Reports last A1c 6.4 in June 2021  On Metformin   Follow-up in the next 2 months for diabetes             I have discussed the diagnosis with the patient and the intended plan as seen in the above orders.  The patient has received an after-visit summary and questions were answered concerning future plans. Pt conveyed understanding of plan.    Medication Side Effects and Warnings were discussed with patient      Dana Bene, MD       Subjective:     Chief Complaint   Patient presents with   . Sleep Problem     New patient, previous PCP went private.  Followed for dep, anx, diabetes.  Last a1c 6.4 in 06/2019  No history of hypertension.  Reports up to date on pap, within last year, no h/o abnormal paps.  Mammogram due to dense materials seen on CT scan.    Mood - Effexor  for nighttime hot flashes.  Feels like it doesn't work well for mood.   Also been on zoloft before that.  Felt it worked well but sexual side effects.  Moved into a new house recently  Sleep - problem more recently, told by wife that she snores, observed apnea.  Will sleep 8+ hours and feel well rested for 30-60 minutes.  Nighttime wakings with palpitations.    Follows with:  Gyn - takes OCPs since 2010, otherwise periods don't stop; G0  Previously with GI: CT abd, colonoscopy      Reviewed PmHx, RxHx, FmHx, SocHx, AllgHx and updated and dated in the chart.    Review of Systems - negative except as listed above in the HPI      Objective:   There were no vitals filed for this visit.    Current Outpatient Medications   Medication Sig   . venlafaxine  (EFFEXOR ) 75 mg  tablet Take 25 mg by mouth three (3) times daily.   . metFORMIN  (GLUCOPHAGE ) 1,000 mg tablet Take 1,000 mg by mouth two (2) times daily (with meals).   . Aurovela Fe 1-20, 28, 1 mg-20 mcg (21)/75 mg (7) tab Take 1 Tablet by mouth daily.     No current facility-administered medications for this visit.       Physical Exam  Vitals and nursing note reviewed.   Constitutional:       General: She is not in acute distress.     Appearance: Normal appearance. She is not ill-appearing, toxic-appearing or diaphoretic.   HENT:      Head: Normocephalic and atraumatic.   Eyes:      General: No scleral icterus.        Right eye: No discharge.         Left eye: No discharge.      Conjunctiva/sclera: Conjunctivae normal.   Pulmonary:      Effort: Pulmonary effort is normal. No respiratory distress.   Musculoskeletal:      Cervical back: No  rigidity.   Skin:     Coloration: Skin is not jaundiced or pale.      Findings: No rash (Of the visualized areas).   Neurological:      Mental Status: She is alert.      Comments: No dysarthria, facial asymmetry, or other gross neurological deficit appreciated within limits of virtual encounter   Psychiatric:         Mood and Affect: Mood normal.         Behavior: Behavior normal.         Thought Content: Thought content normal.         Judgment: Judgment normal.               I was in the office while conducting this encounter.    Total Time: minutes: 11-20 minutes.    Dana Terry is a 39 y.o. female being evaluated by a Virtual Visit (video visit) encounter to address concerns as mentioned above.  A caregiver was present when appropriate. Due to this being a Scientist, research (medical) (During COVID-19 public health emergency), evaluation of the following organ systems was limited: Vitals/Constitutional/EENT/Resp/CV/GI/GU/MS/Neuro/Skin/Heme-Lymph-Imm.  Pursuant to the emergency declaration under the [facility] Act and the [employer], 1135 waiver authority and the [employer]  and [employer] Act, this Virtual Visit was conducted with patient's (and/or legal guardian's) consent, to reduce the risk of exposure to COVID-19 and provide necessary medical care.      Services were provided through a video synchronous discussion virtually to substitute for in-person encounter.    --Dana Bene, MD on 10/24/2019 at 10:01 AM    An electronic signature was used to authenticate this note.

## 2019-10-18 NOTE — Progress Notes (Signed)
 Dana Terry is a 39 y.o. female , id x 2(name and DOB). Reviewed questionnaires, and  medications.    Chief Complaint   Patient presents with   . Sleep Problem       3 most recent PHQ Screens 10/18/2019   Little interest or pleasure in doing things Not at all   Feeling down, depressed, irritable, or hopeless Not at all   Total Score PHQ 2 0

## 2019-10-28 IMAGING — CR DG ABDOMEN 2V
3 series · 3 of 3 positions shown · non-contrast
Comparison: None.

CLINICAL DATA: 37-year-old female with a history abdominal pain

EXAM:
ABDOMEN - 2 VIEW

[abdomen erect]
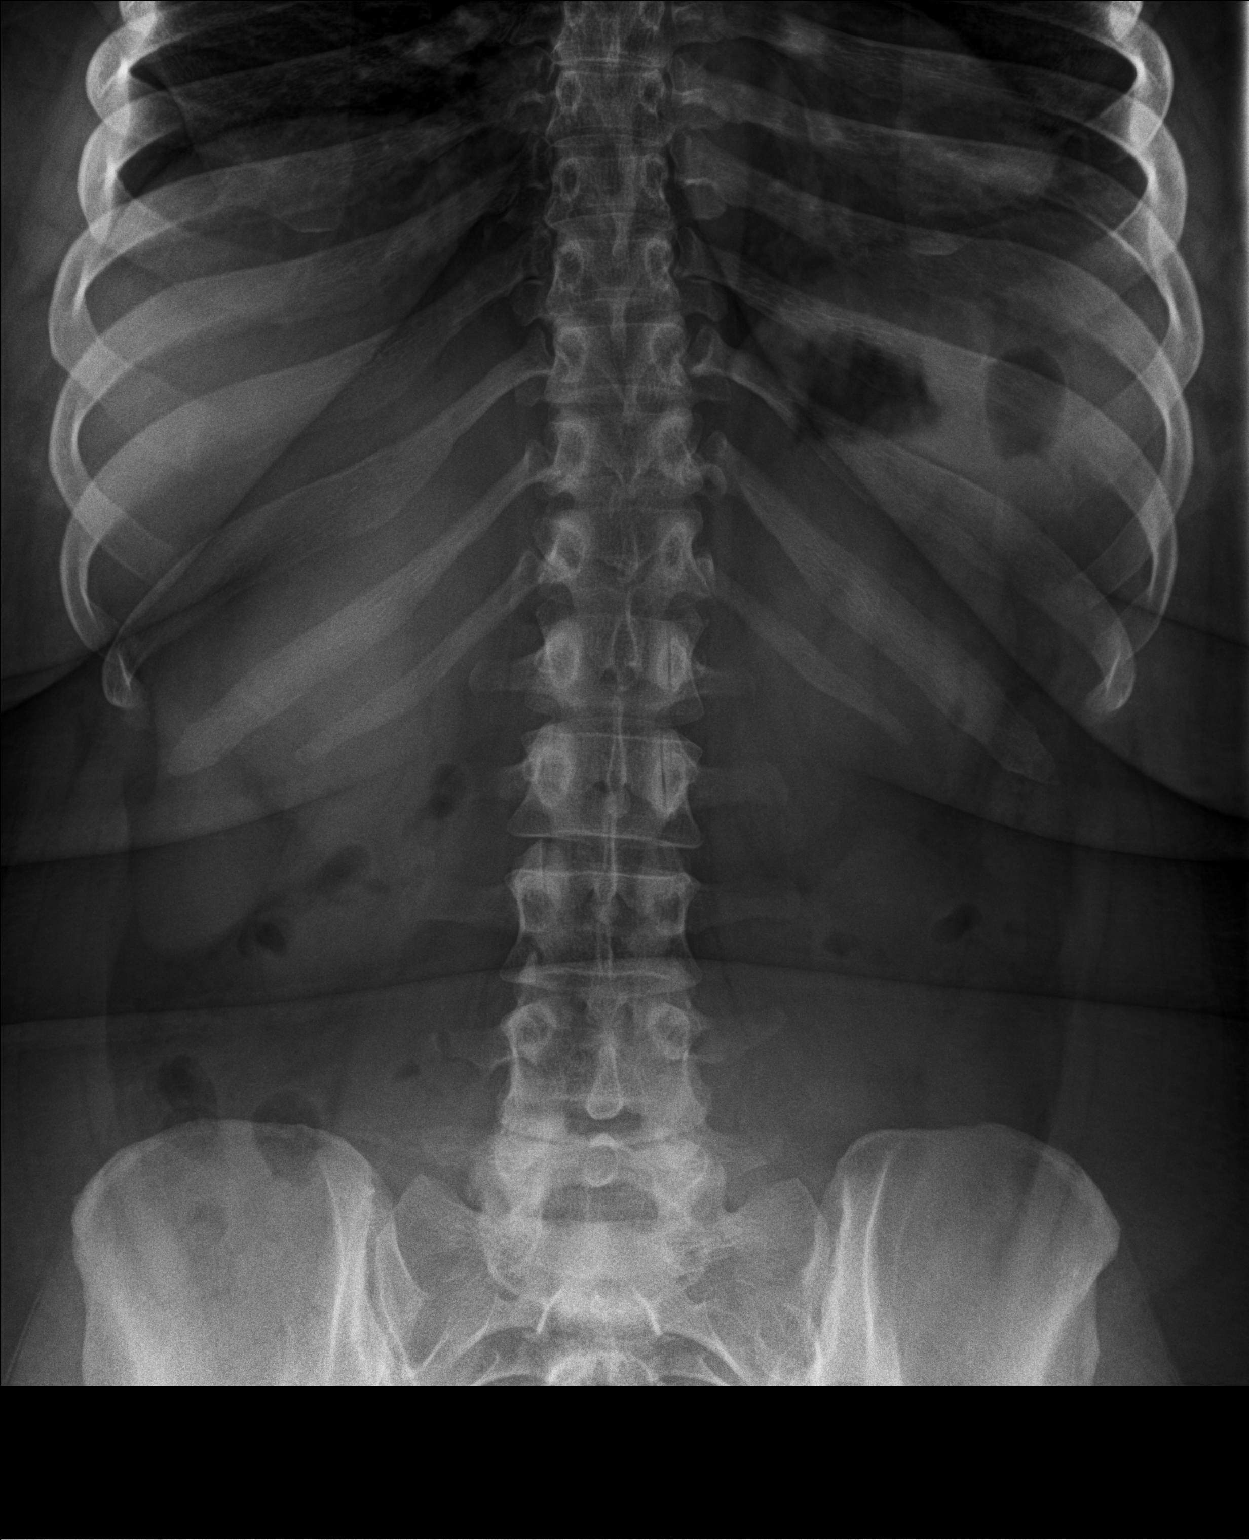

[abdomen supine (1 of 2)]
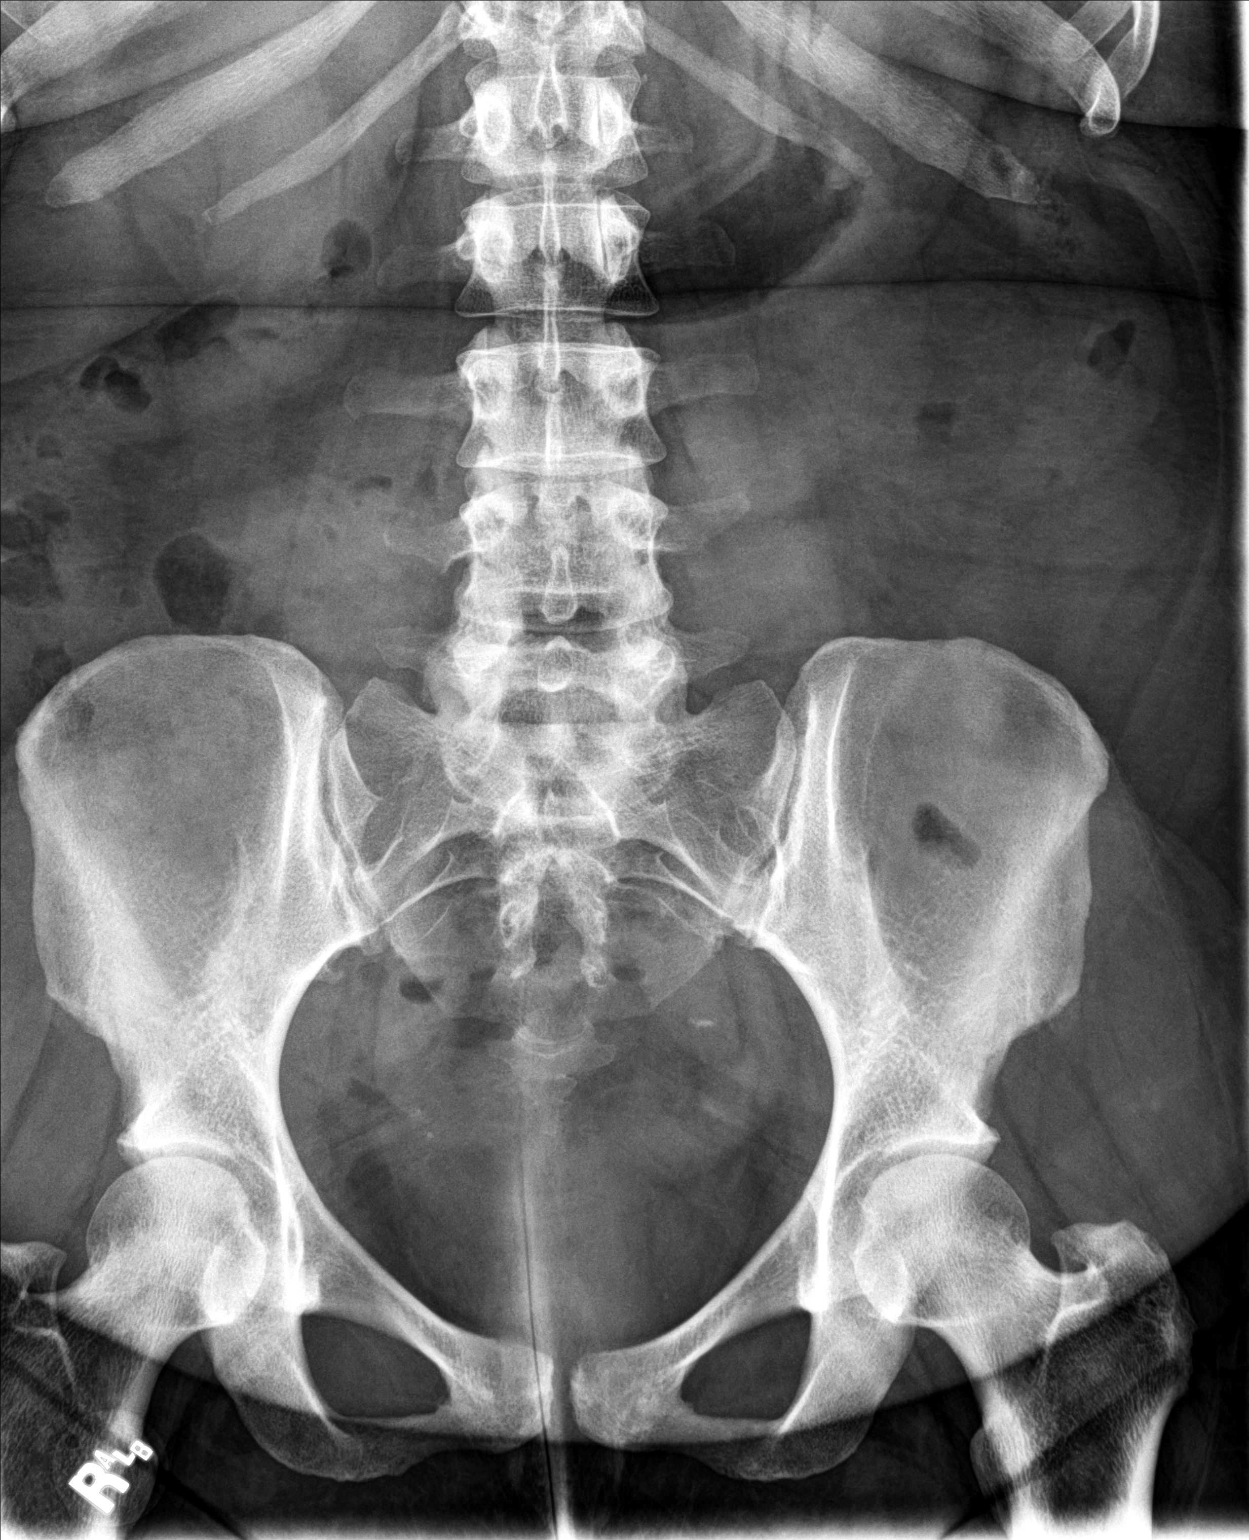

[abdomen supine (2 of 2)]
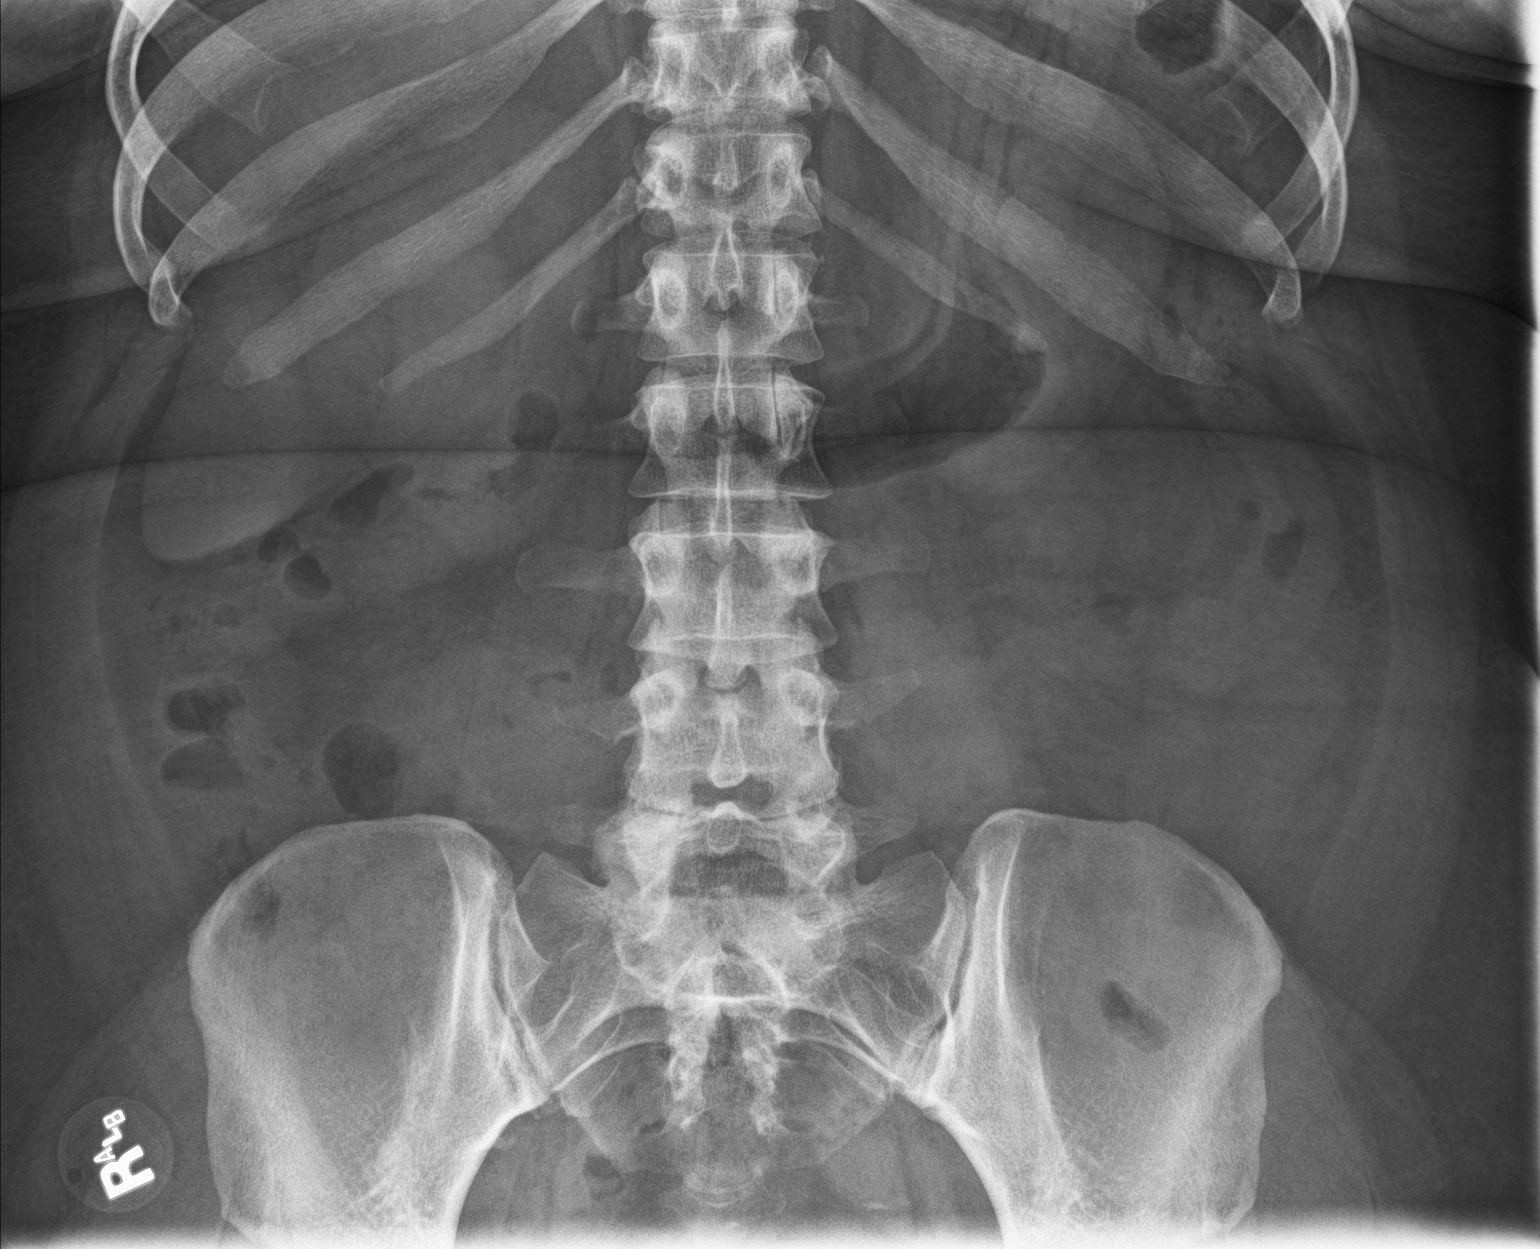

[3 of 3 positions shown; findings below may reference images not displayed]

FINDINGS: The bowel gas pattern is normal. There is no evidence of free air.
No radio-opaque calculi or other significant radiographic
abnormality is seen.
IMPRESSION: Negative plain film abdomen

## 2019-11-02 ENCOUNTER — Ambulatory Visit
Admit: 2019-11-02 | Discharge: 2019-11-02 | Payer: PRIVATE HEALTH INSURANCE | Attending: Specialist | Primary: Family Medicine

## 2019-11-02 ENCOUNTER — Ambulatory Visit: Attending: Specialist | Primary: Family Medicine

## 2019-11-02 DIAGNOSIS — G4733 Obstructive sleep apnea (adult) (pediatric): Secondary | ICD-10-CM

## 2019-11-02 NOTE — Progress Notes (Signed)
Progress Notes by Charlestine Night, MD at 11/02/19 1040                Author: Charlestine Night, MD  Service: --  Author Type: Physician       Filed: 11/02/19 1330  Encounter Date: 11/02/2019  Status: Signed          Editor: Charlestine Night, MD (Physician)                                             [address] [place], [place] [address]   Tel.  [phone]   Fax. [phone]  [address]   [place], [place] [address]   Tel.  [phone]   Fax. [phone]  [address]   [place], [place] [address]   Tel.  [phone]   Fax. [phone]             Chief Complaint            Chief Complaint       Patient presents with        ?  Sleep Problem             F2F  NP refd by Dr. Valda Lamb             HPI         Dana Terry is 39 y.o.  female seen for evaluation of a sleep disorder.  She has a history of snoring; described  as loud.  Snoring associated with apparent apnea.  She normally retires at 10: 30 p.m. and will get a bed at 6: 30 AM.  She may awaken at least once.  She awakens with an alarm.  She does note frequent dreams.      She denies excessive daytime sleepiness.  She notes apparent bruxism, sleep talking snoring and apnea.  She denies sleepwalking, nocturnal incontinence, abnormal arm movements, hypnagogic hallucinations, sleep paralysis or cataplexy.      The patient has not undergone diagnostic testing for the current problems.          Epworth Sleepiness Score: 4           Allergies        Allergen  Reactions         ?  Sulfa (Sulfonamide Antibiotics)  Swelling             Current Outpatient Medications          Medication  Sig  Dispense  Refill           ?  venlafaxine (EFFEXOR) 75 mg tablet  Take 25 mg by mouth three (3) times daily.         ?  metFORMIN (GLUCOPHAGE) 1,000 mg tablet  Take 1,000 mg by mouth two (2) times daily (with meals).               ?  Aurovela Fe 1-20, 28, 1 mg-20 mcg (21)/75 mg (7) tab  Take 1 Tablet by mouth daily.                She   has no past medical history on file.      She  has no past surgical history on file.      She family history includes Breast Cancer in her mother; Diabetes in her brother and  mother; Hypertension in her brother, father, and mother.      She  reports that she has never smoked. She has never used smokeless tobacco. She reports  that she does not drink alcohol and does not use drugs.       Review of Systems:   Review of Systems    Constitutional: Negative for chills and fever.    HENT: Negative for hearing loss and tinnitus.     Eyes: Negative for blurred vision and double vision.    Respiratory: Negative for cough and shortness of breath.     Cardiovascular: Positive for palpitations. Negative for chest pain.    Gastrointestinal: Negative for abdominal pain and heartburn.    Genitourinary: Negative for urgency.    Musculoskeletal: Negative for back pain and neck pain.    Skin: Negative for itching and rash.    Neurological: Negative for dizziness and headaches.    Psychiatric/Behavioral: Positive for depression. The patient is nervous/anxious .                Objective:        Visit Vitals      BP  (!) 158/106 (BP 1 Location: Right arm, BP Patient Position: Sitting, BP Cuff Size: Adult)     Pulse  (!) 105     Ht  5\' 1"  (1.549 m)     Wt  245 lb (111.1 kg)     SpO2  98%        BMI  46.29 kg/m??        Body mass index is 46.29 kg/m??.         General:    Conversant, cooperative     Eyes:   Pupils equal and reactive, no nystagmus     Oropharynx:    Mallampati score IV, tongue large            Neck:    No carotid bruits; Neck circ. in "inches": 17        Chest/Lungs:   Clear on auscultation         CVS:   Normal rate, regular rhythm     Skin:   Warm to touch; no obvious rashes     Neuro:   Speech fluent, face symmetrical, tongue movement normal        Psych:   Normal affect,  normal countenance              Assessment:                  ICD-10-CM  ICD-9-CM             1.  OSA (obstructive sleep apnea)   G47.33  327.23  SLEEP  STUDY UNATTENDED, 4 CHANNEL           2.  Morbid obesity with BMI of 45.0-49.9, adult (HCC)   E66.01  278.01  SLEEP STUDY UNATTENDED, 4 CHANNEL            Z68.42  V85.42             3.  Essential hypertension   I10  401.9          History consistent with sleep disordered breathing.  Patient has long soft palate, large tongue.  Potentially, sleep disordered breathing more prominent when supine, and/or in rem sleep.      Sleep apnea may be contributing factor to elevated blood pressure.  She was advised that weight reduction measures less effective if sleep disordered  breathing not treated.        Plan:          Orders Placed This Encounter        ?  SLEEP STUDY UNATTENDED, 4 CHANNEL              Order Specific Question:    Reason for Exam              Answer:    snoring           * Patient has a history and examination consistent with the diagnosis of sleep apnea.   *Home sleep testing was ordered for initial evaluation.     * She was provided information on sleep apnea including corresponding risk factors and the importance of proper treatment.    * Treatment options if indicated were reviewed today.        Instructions:    o  The patient would benefit from weight reduction measures.   o  Do not engage in activities requiring a normal degree of alertness if fatigue is present.   o  The patient understands that untreated or undertreated sleep apnea could impair judgement and the ability to function normally during the day.   o  Call or return if symptoms worsen or persist.               Valla Leaver, MD, FAASM   Electronically signed 11/02/19          This note was created using voice recognition software. Despite editing, there may be syntax errors.  This note will not be viewable  in MyChart.

## 2019-11-04 NOTE — Progress Notes (Signed)
Spoke with Amil Amen with patients insurance who states we have to fax PA form over. Faxed over HSAT PA form to [facility] on 11/04/2019. (FAX#: [phone])

## 2019-11-11 ENCOUNTER — Inpatient Hospital Stay: Admit: 2019-12-06 | Payer: PRIVATE HEALTH INSURANCE | Primary: Family Medicine

## 2019-11-11 ENCOUNTER — Ambulatory Visit: Admit: 2019-11-11 | Discharge: 2019-11-11 | Primary: Family Medicine

## 2019-11-11 ENCOUNTER — Ambulatory Visit: Primary: Family Medicine

## 2019-11-11 DIAGNOSIS — G4733 Obstructive sleep apnea (adult) (pediatric): Secondary | ICD-10-CM

## 2019-11-11 NOTE — Progress Notes (Signed)
Progress  Notes by Juanda Bond at 11/11/19 1530                Author: Juanda Bond  Service: --  Author Type: Technologist       Filed: 11/11/19 1546  Encounter Date: 11/11/2019  Status: Signed          Editor: Juanda Bond (Technologist)                                            [address] [place], [place] [address]   Tel.  [phone]   Fax. [phone]  [address]   [place], [place] [address]   Tel.  [phone]   Fax. [phone]  [address]   [place], [place] [address]   Tel.  [phone]   Fax. [phone]           Dana Terry is a  39 y.o. female seen today to receive a home sleep testing unit (HST).        Patient was educated on proper hookup and operation of the HST.     Instruction forms and documentation were reviewed and signed.     The patient demonstrated good understanding of the HST.      O>     There were no vitals taken for this visit.        A>   No diagnosis found.         P>     General information regarding operations and maintenance of the device was provided.     She was provided information on sleep apnea including coresponding risk factors and the importance of proper treatment.      Follow-up appointment was made to return the HST. She will be contacted once the results have been reviewed.     She was asked to contact our office for any problems regarding her  home sleep test study.      [facility] [phone]

## 2019-12-06 NOTE — Telephone Encounter (Signed)
HSAT demonstrated normal AHI of 2.2/h.  When supine AHI of 7.3/h.  Minimal SaO2 89%.  Snoring during 25.4%.    Impression: Overall normal HSAT.  Snoring noted.  Events  primarily when supine.  Weight reduction may be beneficial.    Sleep technologist: Please review study results with the patient.

## 2019-12-06 NOTE — Telephone Encounter (Signed)
Sleep study results relayed via MyChart.

## 2019-12-15 ENCOUNTER — Ambulatory Visit
Admit: 2019-12-15 | Discharge: 2019-12-15 | Payer: PRIVATE HEALTH INSURANCE | Attending: Family Medicine | Primary: Family Medicine

## 2019-12-15 ENCOUNTER — Ambulatory Visit: Attending: Family Medicine | Primary: Family Medicine

## 2019-12-15 DIAGNOSIS — E119 Type 2 diabetes mellitus without complications: Secondary | ICD-10-CM

## 2019-12-15 MED ORDER — METFORMIN 1,000 MG TAB
1000 mg | ORAL_TABLET | Freq: Two times a day (BID) | ORAL | 1 refills | Status: DC
Start: 2019-12-15 — End: 2020-01-03

## 2019-12-15 MED ORDER — AUROVELA FE 1-20 (28) 1 MG-20 MCG (21)/75 MG (7) TABLET
1 mg-20 mcg (2)/75 mg (7) | Freq: Every day | ORAL | 11 refills | Status: AC
Start: 2019-12-15 — End: ?

## 2019-12-15 MED ORDER — VENLAFAXINE 75 MG TAB
75 mg | ORAL_TABLET | Freq: Once | ORAL | 1 refills | Status: AC
Start: 2019-12-15 — End: 2019-12-15

## 2019-12-15 NOTE — Progress Notes (Signed)
Normal CBC, vitamin D, TSH Elevated glucose, otherwise normal CMP A1c close, but above goal at 7.1.  Focus on diabetic diet and increase activity LDL above goal for diabetes, recommend statin No microalbuminuria

## 2019-12-15 NOTE — Progress Notes (Signed)
 Vendetta Livermore is a 40 y.o. female , id x 2(name and DOB). Reviewed record, history, and  medications.    Chief Complaint   Patient presents with   . Diabetes     2 month f/up       Vitals:    12/15/19 1036   BP: (!) 141/94   Pulse: (!) 118   Temp: 99.2 F (37.3 C)   SpO2: 98%   Weight: 248 lb (112.5 kg)   Height: 5' 1 (1.549 m)       Coordination of Care Questionnaire:   1) Have you been to an emergency room, urgent care, or hospitalized since your last visit?   no       2. Have seen or consulted any other health care provider since your last visit? NO      3 most recent PHQ Screens 12/15/2019   Little interest or pleasure in doing things Not at all   Feeling down, depressed, irritable, or hopeless Not at all   Total Score PHQ 2 0       Patient is accompanied by self I have received verbal consent from Phung Holzheimer to discuss any/all medical information while they are present in the room.    After obtaining Hang Sinyard's consent, and per orders of Dr.Kluball, the vaccines ordered were given by Aloysius BROCKS Friend, LPN. Patient instructed to remain in clinic for 20 minutes afterwards, and to report any adverse reaction to me immediately. Patient did not display any adverse side effects.      Pt / caregiver given opportunity to review vaccine information sheet prior to vaccine administration. Opportunity given for questions and concerns. No questions or concerns at this time.

## 2019-12-15 NOTE — Progress Notes (Signed)
 Progress Note    she is a 39 y.o. year old female who presents for evalution.      Assessment/ Plan:   Diagnoses and all orders for this visit:    1. Controlled type 2 diabetes mellitus without complication, without long-term current use of insulin (HCC)  -     HEMOGLOBIN A1C WITH EAG; Future  -     LIPID PANEL; Future  -     METABOLIC PANEL, COMPREHENSIVE; Future  -     MICROALBUMIN, UR, RAND W/ MICROALB/CREAT RATIO; Future  -     HM DIABETES FOOT EXAM  -     metFORMIN  (GLUCOPHAGE ) 1,000 mg tablet; Take 1 Tablet by mouth two (2) times daily (with meals).    2. Class 3 severe obesity due to excess calories with serious comorbidity and body mass index (BMI) of 45.0 to 49.9 in adult [facility])  Assessment & Plan:   uncontrolled, continue current medications, lifestyle modifications recommended   Increase activity, exercise recommendations given  Decrease portions and focus on healthy diet changes.    Orders:  -     CBC WITH AUTOMATED DIFF; Future  -     TSH 3RD GENERATION; Future  -     VITAMIN D, 25 HYDROXY; Future    3. Irregular menses  -     Aurovela Fe 1-20, 28, 1 mg-20 mcg (21)/75 mg (7) tab; Take 1 Tablet by mouth daily.    4. Anxiety and depression  -     venlafaxine  (EFFEXOR ) 75 mg tablet; Take 1 Tablet by mouth once for 1 dose.    5. Needs flu shot  -     INFLUENZA VIRUS VAC QUAD,SPLIT,PRESV FREE SYRINGE IM    6. Elevated BP without diagnosis of hypertension  Assessment & Plan:  Home monitoring recommended, follow-up sooner for persistent elevations      7. Snoring  Assessment & Plan:  Sleep study without OSA 11/2019      Other orders  -     CBC WITH AUTOMATED DIFF  -     METABOLIC PANEL, COMPREHENSIVE  -     LIPID PANEL  -     MICROALBUMIN, UR, RAND W/ MICROALB/CREAT RATIO  -     CVD REPORT  -     HEMOGLOBIN A1C WITH EAG  -     VITAMIN D, 25 HYDROXY  -     TSH 3RD GENERATION  -     SPECIMEN STATUS REPORT       Follow-up and Dispositions     Return in about 3 months (around 03/14/2020) for follow-up  diabetes.           I have discussed the diagnosis with the patient and the intended plan as seen in the above orders.    The patient has received an after-visit summary and questions were answered concerning future plans.   Pt conveyed understanding of plan.    Medication Side Effects and Warnings were discussed with patient        Subjective:     Chief Complaint   Patient presents with   . Diabetes     2 month f/up     Last labs 06/2019 and diabetes was well controlled  Reports bad year for eating and weight gain   Moved  Working on getting back on track now.  Diet: recently worked on eliminating sugary beverages and going to just water   Been eating out a lot; not much fried food;  minimize processed foods  Exercise: none currently  Works at a nursing home, lots of desk work recently.    Working on sleep hygiene, notes improvement.        Reviewed PmHx, RxHx, FmHx, SocHx, AllgHx and updated and dated in the chart.    Review of Systems - negative except as listed above in the HPI    Objective:     Vitals:    12/15/19 1036   BP: (!) 141/94   Pulse: (!) 118   Temp: 99.2 F (37.3 C)   SpO2: 98%   Weight: 248 lb (112.5 kg)   Height: 5' 1 (1.549 m)       Current Outpatient Medications   Medication Sig   . cholecalciferol (Vitamin D3) (1000 Units /25 mcg) tablet Take  by mouth daily.   . Ca comb no.1/vit D3/B6/FA/B12 (HEARTBURN & ACID REFLUX PO) Take  by mouth.   . Aurovela Fe 1-20, 28, 1 mg-20 mcg (21)/75 mg (7) tab Take 1 Tablet by mouth daily.   . metFORMIN  (GLUCOPHAGE ) 1,000 mg tablet Take 1 Tablet by mouth two (2) times daily (with meals).     No current facility-administered medications for this visit.       Physical Exam  Vitals and nursing note reviewed.   Constitutional:       General: She is not in acute distress.     Appearance: Normal appearance. She is obese. She is not ill-appearing, toxic-appearing or diaphoretic.   HENT:      Head: Normocephalic and atraumatic.   Eyes:      General: No scleral icterus.         Right eye: No discharge.         Left eye: No discharge.      Conjunctiva/sclera: Conjunctivae normal.   Cardiovascular:      Rate and Rhythm: Normal rate and regular rhythm.      Pulses: Normal pulses.      Heart sounds: Normal heart sounds.   Pulmonary:      Effort: Pulmonary effort is normal. No respiratory distress.      Breath sounds: Normal breath sounds.   Musculoskeletal:         General: No tenderness.      Cervical back: No rigidity.      Right lower leg: No edema.      Left lower leg: No edema.   Skin:     General: Skin is warm and dry.   Neurological:      General: No focal deficit present.      Mental Status: She is alert.   Psychiatric:         Mood and Affect: Mood normal.         Behavior: Behavior normal.         Thought Content: Thought content normal.         Judgment: Judgment normal.              Damien Bene, MD

## 2019-12-16 LAB — COMPREHENSIVE METABOLIC PANEL
ALT: 29 IU/L (ref 0–32)
AST: 25 IU/L (ref 0–40)
Albumin/Globulin Ratio: 1.4 NA (ref 1.2–2.2)
Albumin: 4.2 g/dL (ref 3.8–4.8)
Alkaline Phosphatase: 51 IU/L (ref 44–121)
BUN/Creatinine Ratio: 16 NA (ref 9–23)
BUN: 13 mg/dL (ref 6–20)
CO2: 23 mmol/L (ref 20–29)
Calcium: 9.8 mg/dL (ref 8.7–10.2)
Chloride: 99 mmol/L (ref 96–106)
Creatinine: 0.82 mg/dL (ref 0.57–1.00)
GFR African American: 104 mL/min/{1.73_m2} (ref >59)
Globulin, Total: 3.1 g/dL (ref 1.5–4.5)
Glucose: 122 mg/dL — ABNORMAL HIGH (ref 65–99)
Potassium: 4.4 mmol/L (ref 3.5–5.2)
Sodium: 138 mmol/L (ref 134–144)
Total Bilirubin: 0.5 mg/dL (ref 0.0–1.2)
Total Protein: 7.3 g/dL (ref 6.0–8.5)
eGFR NON-AA: 90 mL/min/{1.73_m2} (ref >59)

## 2019-12-16 LAB — CBC WITH AUTO DIFFERENTIAL
Basophils %: 0 %
Basophils Absolute: 0 10*3/uL (ref 0.0–0.2)
Eosinophils %: 2 %
Eosinophils Absolute: 0.2 10*3/uL (ref 0.0–0.4)
Granulocyte Absolute Count: 0 10*3/uL (ref 0.0–0.1)
Hematocrit: 39.2 % (ref 34.0–46.6)
Hemoglobin: 13.1 g/dL (ref 11.1–15.9)
Immature Granulocytes %: 0 %
Lymphocytes %: 24 %
Lymphocytes Absolute: 2.7 10*3/uL (ref 0.7–3.1)
MCH: 28.9 pg (ref 26.6–33.0)
MCHC: 33.4 g/dL (ref 31.5–35.7)
MCV: 86 fL (ref 79–97)
Monocytes %: 7 %
Monocytes Absolute: 0.8 10*3/uL (ref 0.1–0.9)
Neutrophils %: 67 %
Neutrophils Absolute: 7.5 10*3/uL — ABNORMAL HIGH (ref 1.4–7.0)
Platelets: 287 10*3/uL (ref 150–450)
RBC: 4.54 x10E6/uL (ref 3.77–5.28)
RDW: 12.9 % (ref 11.7–15.4)
WBC: 11.3 10*3/uL — ABNORMAL HIGH (ref 3.4–10.8)

## 2019-12-16 LAB — LIPID PANEL
Cholesterol, Total: 182 mg/dL (ref 100–199)
Cholesterol, total: 182 mg/dL (ref 100–199)
HDL Cholesterol: 71 mg/dL (ref >39)
HDL: 71 mg/dL (ref >39)
LDL Calculated: 93 mg/dL (ref 0–99)
LDL, calculated: 93 mg/dL (ref 0–99)
Triglyceride: 103 mg/dL (ref 0–149)
Triglycerides: 103 mg/dL (ref 0–149)
VLDL, calculated: 18 mg/dL (ref 5–40)
VLDL: 18 mg/dL (ref 5–40)

## 2019-12-16 LAB — ALBUMIN/CREATININE RATIO, URINE
Albumin Urine: 16 ug/mL
Albumin/Creatinine Ratio: 6 mg/g{creat} (ref 0–29)
Creatinine, Ur: 290.6 mg/dL

## 2019-12-16 LAB — TSH 3RD GENERATION
TSH: 1.15 u[IU]/mL (ref 0.450–4.500)
TSH: 1.15 u[IU]/mL (ref 0.450–4.500)

## 2019-12-16 LAB — CVD REPORT

## 2019-12-16 LAB — HEMOGLOBIN A1C W/EAG
Estimated Avg Glucose: 157 mg/dL
Hemoglobin A1C: 7.1 % — ABNORMAL HIGH (ref 4.8–5.6)

## 2019-12-16 LAB — VITAMIN D 25 HYDROXY: Vit D, 25-Hydroxy: 36 ng/mL (ref 30.0–100.0)

## 2019-12-16 LAB — SPECIMEN STATUS REPORT

## 2019-12-16 LAB — METABOLIC PANEL, COMPREHENSIVE
A-G Ratio: 1.4 (ref 1.2–2.2)
ALT (SGPT): 29 IU/L (ref 0–32)
AST (SGOT): 25 IU/L (ref 0–40)
Albumin: 4.2 g/dL (ref 3.8–4.8)
Alk. phosphatase: 51 IU/L (ref 44–121)
BUN/Creatinine ratio: 16 (ref 9–23)
BUN: 13 mg/dL (ref 6–20)
Bilirubin, total: 0.5 mg/dL (ref 0.0–1.2)
CO2: 23 mmol/L (ref 20–29)
Calcium: 9.8 mg/dL (ref 8.7–10.2)
Chloride: 99 mmol/L (ref 96–106)
Creatinine: 0.82 mg/dL (ref 0.57–1.00)
GFR est AA: 104 mL/min/{1.73_m2} (ref >59)
GFR est non-AA: 90 mL/min/{1.73_m2} (ref >59)
GLOBULIN, TOTAL: 3.1 g/dL (ref 1.5–4.5)
Glucose: 122 mg/dL — ABNORMAL HIGH (ref 65–99)
Potassium: 4.4 mmol/L (ref 3.5–5.2)
Protein, total: 7.3 g/dL (ref 6.0–8.5)
Sodium: 138 mmol/L (ref 134–144)

## 2019-12-16 LAB — CBC WITH AUTOMATED DIFF
ABS. BASOPHILS: 0 10*3/uL (ref 0.0–0.2)
ABS. EOSINOPHILS: 0.2 10*3/uL (ref 0.0–0.4)
ABS. IMM. GRANS.: 0 10*3/uL (ref 0.0–0.1)
ABS. MONOCYTES: 0.8 10*3/uL (ref 0.1–0.9)
ABS. NEUTROPHILS: 7.5 10*3/uL — ABNORMAL HIGH (ref 1.4–7.0)
Abs Lymphocytes: 2.7 10*3/uL (ref 0.7–3.1)
BASOPHILS: 0 %
EOSINOPHILS: 2 %
HCT: 39.2 % (ref 34.0–46.6)
HGB: 13.1 g/dL (ref 11.1–15.9)
IMMATURE GRANULOCYTES: 0 %
Lymphocytes: 24 %
MCH: 28.9 pg (ref 26.6–33.0)
MCHC: 33.4 g/dL (ref 31.5–35.7)
MCV: 86 fL (ref 79–97)
MONOCYTES: 7 %
NEUTROPHILS: 67 %
PLATELET: 287 10*3/uL (ref 150–450)
RBC: 4.54 x10E6/uL (ref 3.77–5.28)
RDW: 12.9 % (ref 11.7–15.4)
WBC: 11.3 10*3/uL — ABNORMAL HIGH (ref 3.4–10.8)

## 2019-12-16 LAB — HEMOGLOBIN A1C WITH EAG
Estimated average glucose: 157 mg/dL
Hemoglobin A1c: 7.1 % — ABNORMAL HIGH (ref 4.8–5.6)

## 2019-12-16 LAB — MICROALBUMIN, UR, RAND W/ MICROALB/CREAT RATIO
Creatinine, urine random: 290.6 mg/dL
Microalb/Creat ratio (ug/mg creat.): 6 mg/g creat (ref 0–29)
Microalbumin, urine: 16 ug/mL

## 2019-12-16 LAB — VITAMIN D, 25 HYDROXY: VITAMIN D, 25-HYDROXY: 36 ng/mL (ref 30.0–100.0)

## 2019-12-26 NOTE — Assessment & Plan Note (Signed)
Sleep study without OSA 11/2019

## 2019-12-26 NOTE — Assessment & Plan Note (Signed)
uncontrolled, continue current medications, lifestyle modifications recommended   Increase activity, exercise recommendations given  Decrease portions and focus on healthy diet changes.

## 2019-12-26 NOTE — Assessment & Plan Note (Signed)
Home monitoring recommended, follow-up sooner for persistent elevations

## 2020-01-01 ENCOUNTER — Encounter

## 2020-01-03 MED ORDER — METFORMIN SR 500 MG 24 HR TABLET
500 mg | ORAL_TABLET | Freq: Two times a day (BID) | ORAL | 1 refills | Status: DC
Start: 2020-01-03 — End: 2020-07-19

## 2020-03-21 ENCOUNTER — Ambulatory Visit
Admit: 2020-03-21 | Discharge: 2020-03-21 | Payer: PRIVATE HEALTH INSURANCE | Attending: Family Medicine | Primary: Family Medicine

## 2020-03-21 ENCOUNTER — Ambulatory Visit: Attending: Family Medicine | Primary: Family Medicine

## 2020-03-21 DIAGNOSIS — E119 Type 2 diabetes mellitus without complications: Secondary | ICD-10-CM

## 2020-03-21 MED ORDER — CLINDAMYCIN 1 % TOPICAL SOLN
1 % | CUTANEOUS | 1 refills | Status: AC
Start: 2020-03-21 — End: ?

## 2020-03-21 MED ORDER — VALACYCLOVIR 1 G TAB
1 gram | ORAL_TABLET | Freq: Two times a day (BID) | ORAL | 5 refills | Status: AC
Start: 2020-03-21 — End: 2020-03-22

## 2020-03-21 NOTE — Assessment & Plan Note (Signed)
Recurrent boils - Assessment & Plan Note by Valda Lamb, MD at 03/21/20 1421                Author: Valda Lamb, MD  Service: --  Author Type: Physician       Filed: 03/21/20 1422  Encounter Date: 03/21/2020  Status: Written          Editor: Valda Lamb, MD (Physician)               Posterior neck, with scarring.   Start topical clindamycin.  Consider further rx for failure to improve.

## 2020-03-21 NOTE — Assessment & Plan Note (Signed)
Vitamin D deficiency - Assessment & Plan Note by Valda Lamb, MD at 03/21/20 1424                Author: Valda Lamb, MD  Service: --  Author Type: Physician       Filed: 03/21/20 1424  Encounter Date: 03/21/2020  Status: Written          Editor: Valda Lamb, MD (Physician)               Historical diagnosis, on otc supplement   Recheck level today

## 2020-03-21 NOTE — Addendum Note (Signed)
Addendum Note  by Marianna Fuss at 03/21/20 1330                Author: Marianna Fuss  Service: --  Author Type: Technician       Filed: 03/21/20 1503  Encounter Date: 03/21/2020  Status: Signed          Editor: Marianna Fuss (Technician)          Addended by: Marianna Fuss on: 03/21/2020 03:03 PM    Modules accepted: Orders

## 2020-03-21 NOTE — Assessment & Plan Note (Signed)
Anxiety and depression - Assessment & Plan Note by Valda Lamb, MD at 03/21/20 1421                Author: Valda Lamb, MD  Service: --  Author Type: Physician       Filed: 03/21/20 1421  Encounter Date: 03/21/2020  Status: Written          Editor: Valda Lamb, MD (Physician)                well controlled, continue current medications

## 2020-03-21 NOTE — Progress Notes (Signed)
 Progress  Notes by Nohemi Perkins, MD at 03/21/20 1330                Author: Nohemi Perkins, MD  Service: --  Author Type: Physician       Filed: 03/21/20 1428  Encounter Date: 03/21/2020  Status: Signed          Editor: Nohemi Perkins, MD (Physician)                                Progress Note      she is a 40 y.o.  year old female who presents for evalution.           Assessment/ Plan:     Diagnoses and all orders for this visit:      1. Type 2 diabetes mellitus without complication, without long-term current use of insulin (HCC)   Assessment & Plan:   Unclear control, variable fasting BG.   Encouraged to increase activity level and keep focusing on healthy diet.   Continue current medications at this time.   Check A1c today.      Orders:   -     METABOLIC PANEL, COMPREHENSIVE; Future   -     HEMOGLOBIN A1C WITH EAG; Future   -     LIPID PANEL; Future      2. Anxiety and depression   Assessment & Plan:    well controlled, continue current medications         3. Recurrent boils   Assessment & Plan:   Posterior neck, with scarring.   Start topical clindamycin.  Consider further rx for failure to improve.      Orders:   -     clindamycin (CLEOCIN T) 1 % external solution; use thin film on affected area two times daily.      4. 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  Consider preventative rx for failure to control sxs adequately.      Orders:   -     HSV 1/2 AB, IGG/IGM; Future   -     valACYclovir  (VALTREX ) 1 gram tablet; Take 2 Tablets by mouth two (2) times a day for 1 day. To be taken with initial symptoms of outbreak.      5. Elevated BP without diagnosis of hypertension   Assessment & Plan:   Blood pressure well controlled without medications.   Encouraged to increase activity and continue low salt efforts      Orders:   -     CBC WITH AUTOMATED DIFF; Future      6. Vitamin D deficiency   Assessment & Plan:   Historical  diagnosis, on otc supplement   Recheck level today      Orders:   -     VITAMIN D, 25 HYDROXY; Future      7. Gastroesophageal reflux disease, unspecified whether esophagitis present   Assessment & Plan:   Well controlled on otc rx         8. Encounter for hepatitis C screening test for low risk patient   -     HCV AB W/RFLX TO NAA; Future       Records release forms provided to request mammogram and pap smear results        Follow-up and Dispositions  Return in about 3 months (around 06/21/2020) for follow-up diabetes.                   I have discussed the diagnosis with the patient and the intended plan as seen in the above orders.     The patient has received an after-visit summary and questions were answered concerning future plans.    Pt conveyed understanding of plan.      Medication Side Effects and Warnings were discussed with patient              Subjective:          Chief Complaint       Patient presents with        ?  Diabetes             numbers have not improved but eating habits have. states that she has had glucose problems since having COVID in august.         ?  Mass             states that she gets masses on the back on her neck that are tender and sometimes burst. has one presently.         Reports compliance with diabetic diet    [employer], going well so eating a lot more at home   Feels like she can't avoid exercise anymore    Planning to start zumba with wife   Checking fasting sugars, feels like it is all over the place    Had a snack and early dinner yesterday, BG was 170 this morning.    Feels like it doesn't always matter what she eats.   Stressful job   Home and relationship with wife are happy      Saw GYN.  Was recommended to try non-hormonal birth control: Slynda    Last pap was 2021, plan to repeat next year.   Mammogram last year due to dense tissue on CT scan.      GERD - has to consistently take otc medication      Vit D - taking otc supplement      Recurrent boils on the  back of her neck   Tend to happen once/month      Recurrent oral cold sores around the lips and in the nose.   Experiences a lot of pain with them as well.   Has been on medication once before   Happening about once every other month.   Increased with stress, has prodromal symptoms.         Reviewed PmHx, RxHx, FmHx, SocHx, AllgHx and updated and dated in the chart.      Review of Systems - negative except as listed above in the HPI        Objective:          Vitals:          03/21/20 1336        BP:  117/82     Pulse:  92     Temp:  98.2 F (36.8 C)     SpO2:  98%     Weight:  250 lb 12.8 oz (113.8 kg)        Height:  5' 1 (1.549 m)             Current Outpatient Medications        Medication  Sig         ?  venlafaxine  (  EFFEXOR ) 75 mg tablet  Take 75 mg by mouth daily.     ?  clindamycin (CLEOCIN T) 1 % external solution  use thin film on affected area two times daily.     ?  valACYclovir  (VALTREX ) 1 gram tablet  Take 2 Tablets by mouth two (2) times a day for 1 day. To be taken with initial symptoms of outbreak.     ?  metFORMIN  ER (GLUCOPHAGE  XR) 500 mg tablet  Take 2 Tablets by mouth two (2) times a day.     ?  cholecalciferol (Vitamin D3) (1000 Units /25 mcg) tablet  Take  by mouth daily.     ?  Ca comb no.1/vit D3/B6/FA/B12 (HEARTBURN & ACID REFLUX PO)  Take  by mouth.         ?  Aurovela Fe 1-20, 28, 1 mg-20 mcg (21)/75 mg (7) tab  Take 1 Tablet by mouth daily.          No current facility-administered medications for this visit.           Physical Exam   Vitals and nursing note reviewed.   Constitutional:        General: She is not in acute distress.     Appearance: Normal appearance. She is obese. She is not ill-appearing, toxic-appearing  or diaphoretic.   HENT :       Head: Normocephalic and atraumatic.   Eyes :       General: No scleral icterus.        Right eye: No discharge.         Left eye: No discharge.      Conjunctiva/sclera: Conjunctivae normal.   Cardiovascular :       Rate and Rhythm: Normal  rate and regular rhythm.      Pulses: Normal pulses.      Heart sounds: Normal heart sounds.    Pulmonary:       Effort: Pulmonary effort is normal. No respiratory distress.      Breath sounds: Normal breath sounds.   Musculoskeletal :          General: No tenderness.      Cervical back: No rigidity or tenderness.      Right lower leg: No edema.      Left lower leg: No edema.     Lymphadenopathy:       Cervical: No cervical adenopathy.   Skin :      General: Skin is warm and dry.      Comments: SQ nodule and thickened tissue at R posterior neck.  There are sinus tracts and mild scarring in the fold of skin    Neurological :       General: No focal deficit present.      Mental Status: She is alert.    Psychiatric:         Mood and Affect: Mood normal.         Behavior: Behavior normal.         Thought Content: Thought content normal.         Judgment: Judgment normal.                    Damien Bene, MD

## 2020-03-21 NOTE — Assessment & Plan Note (Signed)
Elevated BP without diagnosis of hypertension - Assessment & Plan Note by Valda Lamb, MD at 03/21/20 1421                Author: Valda Lamb, MD  Service: --  Author Type: Physician       Filed: 03/21/20 1421  Encounter Date: 03/21/2020  Status: Written          Editor: Valda Lamb, MD (Physician)               Blood pressure well controlled without medications.   Encouraged to increase activity and continue low salt efforts

## 2020-03-21 NOTE — Progress Notes (Signed)
 Progress Notes by Alyse Aloysius BROCKS, LPN at 96/84/77 1330                Author: Alyse Aloysius BROCKS, LPN  Service: --  Author Type: Licensed Nurse       Filed: 03/21/20 1428  Encounter Date: 03/21/2020  Status: Signed          Editor: Alyse Aloysius BROCKS, LPN (Licensed Nurse)               Dana Terry     is a 40 y.o. female , id x 2(name  and DOB). Reviewed record, history, and  medications.        Chief Complaint       Patient presents with        ?  Diabetes             numbers have not improved but eating habits have. states that she has had glucose problems since having COVID in august.         ?  Mass             states that she gets masses on the back on her neck that are tender and sometimes burst. has one presently.      * saw GYN last [id number] 98.2          Vitals:          03/21/20 1336        BP:  117/82     Pulse:  92     Temp:  98.2 F (36.8 C)     SpO2:  98%     Weight:  250 lb 12.8 oz (113.8 kg)        Height:  5' 1 (1.549 m)             Coordination of Care Questionnaire:     1) Have you been to an emergency room, urgent care, or hospitalized since your last visit?   no         2. Have seen or consulted any other health care provider since your last visit? NO            3 most recent PHQ Screens  03/21/2020        Little interest or pleasure in doing things  Not at all     Feeling down, depressed, irritable, or hopeless  Not at all        Total Score PHQ 2  0           Patient is accompanied by self I have received verbal consent from [name]  to discuss any/all medical information while they are present in the room.

## 2020-03-21 NOTE — Progress Notes (Signed)
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  CBC stable.  Normal CMP  LDL 85 above goal with diabetes.  Consider addition of medication  A1c increased since 3 months ago.  Diabetes uncontrolled.  Focus on lifestyle measures.  Consider additional rx.  Normal Vit D level.  Negative screening for hep C

## 2020-03-21 NOTE — Assessment & Plan Note (Signed)
Type 2 diabetes mellitus without complication, without long-term current use of insulin [facility]) - Assessment & Plan Note by Valda Lamb, MD at 03/21/20 1423                Author: Valda Lamb, MD  Service: --  Author Type: Physician       Filed: 03/21/20 1424  Encounter Date: 03/21/2020  Status: Written          Editor: Valda Lamb, MD (Physician)               Unclear control, variable fasting BG.   Encouraged to increase activity level and keep focusing on healthy diet.   Continue current medications at this time.   Check A1c today.

## 2020-03-21 NOTE — Assessment & Plan Note (Signed)
Acid reflux - Assessment & Plan Note by Valda Lamb, MD at 03/21/20 1420                Author: Valda Lamb, MD  Service: --  Author Type: Physician       Filed: 03/21/20 1421  Encounter Date: 03/21/2020  Status: Written          Editor: Valda Lamb, MD (Physician)               Well controlled on otc rx

## 2020-03-21 NOTE — Assessment & Plan Note (Signed)
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  Consider preventative rx for failure to control sxs adequately.

## 2020-03-25 LAB — CBC WITH AUTO DIFFERENTIAL
Basophils %: 1 %
Basophils Absolute: 0.1 10*3/uL (ref 0.0–0.2)
Eosinophils %: 2 %
Eosinophils Absolute: 0.3 10*3/uL (ref 0.0–0.4)
Granulocyte Absolute Count: 0 10*3/uL (ref 0.0–0.1)
Hematocrit: 37.1 % (ref 34.0–46.6)
Hemoglobin: 12.3 g/dL (ref 11.1–15.9)
Immature Granulocytes %: 0 %
Lymphocytes %: 29 %
Lymphocytes Absolute: 3.4 10*3/uL — ABNORMAL HIGH (ref 0.7–3.1)
MCH: 28.5 pg (ref 26.6–33.0)
MCHC: 33.2 g/dL (ref 31.5–35.7)
MCV: 86 fL (ref 79–97)
Monocytes %: 7 %
Monocytes Absolute: 0.9 10*3/uL (ref 0.1–0.9)
Neutrophils %: 61 %
Neutrophils Absolute: 7.1 10*3/uL — ABNORMAL HIGH (ref 1.4–7.0)
Platelets: 265 10*3/uL (ref 150–450)
RBC: 4.31 x10E6/uL (ref 3.77–5.28)
RDW: 13.2 % (ref 11.7–15.4)
WBC: 11.7 10*3/uL — ABNORMAL HIGH (ref 3.4–10.8)

## 2020-03-25 LAB — COMPREHENSIVE METABOLIC PANEL
ALT: 11 IU/L (ref 0–32)
AST: 16 IU/L (ref 0–40)
Albumin/Globulin Ratio: 1.3 NA (ref 1.2–2.2)
Albumin: 4.2 g/dL (ref 3.8–4.8)
Alkaline Phosphatase: 47 IU/L (ref 44–121)
BUN/Creatinine Ratio: 15 NA (ref 9–23)
BUN: 10 mg/dL (ref 6–24)
CO2: 21 mmol/L (ref 20–29)
Calcium: 9.6 mg/dL (ref 8.7–10.2)
Chloride: 100 mmol/L (ref 96–106)
Creatinine: 0.68 mg/dL (ref 0.57–1.00)
Est, Glom Filt Rate: 113 mL/min/{1.73_m2} (ref >59)
Globulin, Total: 3.2 g/dL (ref 1.5–4.5)
Glucose: 91 mg/dL (ref 65–99)
Potassium: 3.8 mmol/L (ref 3.5–5.2)
Sodium: 139 mmol/L (ref 134–144)
Total Bilirubin: 0.4 mg/dL (ref 0.0–1.2)
Total Protein: 7.4 g/dL (ref 6.0–8.5)

## 2020-03-25 LAB — CKD REPORT

## 2020-03-25 LAB — HSV 1/2 AB, IGG/IGM
HSV 1 Ab, IgG, type spec.: 51 index — ABNORMAL HIGH (ref 0.00–0.90)
HSV 1 IGG,TYPE SPEC, 164899: 51 index — ABNORMAL HIGH (ref 0.00–0.90)
HSV 2 Ab IgG, type spec.: <0.91 index (ref 0.00–0.90)
HSV 2 IGG, TYPE SPEC, 163153: <0.91 index (ref 0.00–0.90)
HSV I/II AB, IGM: <0.91 Ratio (ref 0.00–0.90)
HSV I/II Ab, IgM: <0.91 Ratio (ref 0.00–0.90)

## 2020-03-25 LAB — LIPID PANEL
Cholesterol, Total: 172 mg/dL (ref 100–199)
Cholesterol, total: 172 mg/dL (ref 100–199)
HDL Cholesterol: 66 mg/dL (ref >39)
HDL: 66 mg/dL (ref >39)
LDL Calculated: 85 mg/dL (ref 0–99)
LDL, calculated: 85 mg/dL (ref 0–99)
Triglyceride: 118 mg/dL (ref 0–149)
Triglycerides: 118 mg/dL (ref 0–149)
VLDL, calculated: 21 mg/dL (ref 5–40)
VLDL: 21 mg/dL (ref 5–40)

## 2020-03-25 LAB — HCV INTERPRETATION

## 2020-03-25 LAB — CVD REPORT

## 2020-03-25 LAB — HEMOGLOBIN A1C W/EAG
Estimated Avg Glucose: 166 mg/dL
Hemoglobin A1C: 7.4 % — ABNORMAL HIGH (ref 4.8–5.6)

## 2020-03-25 LAB — VITAMIN D 25 HYDROXY: Vit D, 25-Hydroxy: 35.4 ng/mL (ref 30.0–100.0)

## 2020-03-25 LAB — HCV AB W/RFLX TO NAA
HCV Ab: <0.1 s/co ratio (ref 0.0–0.9)
Hepatitis C Ab: <0.1 {s_co_ratio} (ref 0.0–0.9)

## 2020-03-25 LAB — METABOLIC PANEL, COMPREHENSIVE
A-G Ratio: 1.3 (ref 1.2–2.2)
ALT (SGPT): 11 IU/L (ref 0–32)
AST (SGOT): 16 IU/L (ref 0–40)
Albumin: 4.2 g/dL (ref 3.8–4.8)
Alk. phosphatase: 47 IU/L (ref 44–121)
BUN/Creatinine ratio: 15 (ref 9–23)
BUN: 10 mg/dL (ref 6–24)
Bilirubin, total: 0.4 mg/dL (ref 0.0–1.2)
CO2: 21 mmol/L (ref 20–29)
Calcium: 9.6 mg/dL (ref 8.7–10.2)
Chloride: 100 mmol/L (ref 96–106)
Creatinine: 0.68 mg/dL (ref 0.57–1.00)
GLOBULIN, TOTAL: 3.2 g/dL (ref 1.5–4.5)
Glucose: 91 mg/dL (ref 65–99)
Potassium: 3.8 mmol/L (ref 3.5–5.2)
Protein, total: 7.4 g/dL (ref 6.0–8.5)
Sodium: 139 mmol/L (ref 134–144)
eGFR: 113 mL/min/{1.73_m2} (ref >59)

## 2020-03-25 LAB — CBC WITH AUTOMATED DIFF
ABS. BASOPHILS: 0.1 10*3/uL (ref 0.0–0.2)
ABS. EOSINOPHILS: 0.3 10*3/uL (ref 0.0–0.4)
ABS. IMM. GRANS.: 0 10*3/uL (ref 0.0–0.1)
ABS. MONOCYTES: 0.9 10*3/uL (ref 0.1–0.9)
ABS. NEUTROPHILS: 7.1 10*3/uL — ABNORMAL HIGH (ref 1.4–7.0)
Abs Lymphocytes: 3.4 10*3/uL — ABNORMAL HIGH (ref 0.7–3.1)
BASOPHILS: 1 %
EOSINOPHILS: 2 %
HCT: 37.1 % (ref 34.0–46.6)
HGB: 12.3 g/dL (ref 11.1–15.9)
IMMATURE GRANULOCYTES: 0 %
Lymphocytes: 29 %
MCH: 28.5 pg (ref 26.6–33.0)
MCHC: 33.2 g/dL (ref 31.5–35.7)
MCV: 86 fL (ref 79–97)
MONOCYTES: 7 %
NEUTROPHILS: 61 %
PLATELET: 265 10*3/uL (ref 150–450)
RBC: 4.31 x10E6/uL (ref 3.77–5.28)
RDW: 13.2 % (ref 11.7–15.4)
WBC: 11.7 10*3/uL — ABNORMAL HIGH (ref 3.4–10.8)

## 2020-03-25 LAB — VITAMIN D, 25 HYDROXY: VITAMIN D, 25-HYDROXY: 35.4 ng/mL (ref 30.0–100.0)

## 2020-03-25 LAB — HEMOGLOBIN A1C WITH EAG
Estimated average glucose: 166 mg/dL
Hemoglobin A1c: 7.4 % — ABNORMAL HIGH (ref 4.8–5.6)

## 2020-03-25 LAB — SPECIMEN STATUS REPORT

## 2020-04-13 NOTE — Telephone Encounter (Signed)
 Received call from Denal at [facility] with Red Flag Complaint.    Subjective: Caller states I have had nausea and vomiting since 4/1- comes and goes.      Current Symptoms: nausea and vomiting- comes and goes, abdominal pain- after she eats, general     Onset: 1 week ago; intermittent    Associated Symptoms: NA    Pain Severity: 5/10; aching; intermittent    Temperature: denies     What has been tried: pepto    LMP: last week Pregnant: No    Recommended disposition: See in Office Today    Care advice provided, patient verbalizes understanding; denies any other questions or concerns; instructed to call back for any new or worsening symptoms.    Patient/Caller agrees with recommended disposition; Clinical research associate provided warm transfer to [facility] for appointment scheduling    Attention Provider:  Thank you for allowing me to participate in the care of your patient.  The patient was connected to triage in response to information provided to the ECC.  Please do not respond through this encounter as the response is not directed to a shared pool.      Reason for Disposition  . MODERATE pain (e.g., interferes with normal activities that comes and goes (cramps) lasts > 24 hours (Exception: pain with Vomiting or Diarrhea - see that Protocol)    Protocols used: ABDOMINAL PAIN - [facility]

## 2020-04-18 ENCOUNTER — Encounter

## 2020-04-20 MED ORDER — OZEMPIC 0.25 MG OR 0.5 MG (2 MG/1.5 ML) SUBCUTANEOUS PEN INJECTOR
0.25 mg or 0.5 mg(2 mg/1.5 mL) | SUBCUTANEOUS | 0 refills | Status: DC
Start: 2020-04-20 — End: 2020-05-18

## 2020-05-17 ENCOUNTER — Encounter

## 2020-05-18 MED ORDER — OZEMPIC 0.25 MG OR 0.5 MG (2 MG/1.5 ML) SUBCUTANEOUS PEN INJECTOR
0.25 mg or 0.5 mg(2 mg/1.5 mL) | SUBCUTANEOUS | 3 refills | Status: DC
Start: 2020-05-18 — End: 2020-06-26

## 2020-06-21 ENCOUNTER — Encounter

## 2020-06-26 MED ORDER — OZEMPIC 0.25 MG OR 0.5 MG (2 MG/1.5 ML) SUBCUTANEOUS PEN INJECTOR
0.250.521.5 mg or 0.5 mg(2 mg/1.5 mL) | SUBCUTANEOUS | 3 refills | Status: AC
Start: 2020-06-26 — End: 2020-11-09

## 2020-06-26 NOTE — Telephone Encounter (Signed)
Telephone Encounter by Valda Lamb, MD at 06/26/20 [phone]                Author: Valda Lamb, MD  Service: --  Author Type: Physician       Filed: 06/26/20 0854  Encounter Date: 06/21/2020  Status: Signed          Editor: Valda Lamb, MD (Physician)          From: Levert FeinsteinValda Lamb, MDSent: 06/21/2020 11:51 AM EDTSubject: OzempicOzempic is working great along with the Metformin.  0.5mg  seems  to be the best dosage.  My numbers are 100-120 in the mornings now.  I haven't had any nausea so far.  I am only able to eat small portions so I feel that my weight is starting to reflect that.  I recently got an eye exam and those results were great.

## 2020-07-03 ENCOUNTER — Encounter

## 2020-07-03 MED ORDER — VENLAFAXINE 75 MG TAB
75 mg | ORAL_TABLET | Freq: Every day | ORAL | 1 refills | Status: DC
Start: 2020-07-03 — End: 2021-01-28

## 2020-07-03 NOTE — Telephone Encounter (Signed)
Medication refilled as requested

## 2020-07-19 ENCOUNTER — Encounter

## 2020-07-19 MED ORDER — METFORMIN SR 500 MG 24 HR TABLET
500 mg | ORAL_TABLET | ORAL | 1 refills | Status: DC
Start: 2020-07-19 — End: 2020-11-22

## 2020-08-08 NOTE — Progress Notes (Signed)
Mammogram results from [facility] was put on Raven LPN desk to process.

## 2020-11-09 ENCOUNTER — Encounter

## 2020-11-10 MED ORDER — OZEMPIC 0.25 MG OR 0.5 MG (2 MG/1.5 ML) SUBCUTANEOUS PEN INJECTOR
0.25 mg or 0.5 mg(2 mg/1.5 mL) | SUBCUTANEOUS | 3 refills | Status: DC
Start: 2020-11-10 — End: 2020-11-22

## 2020-11-22 ENCOUNTER — Ambulatory Visit
Admit: 2020-11-22 | Discharge: 2020-11-22 | Payer: PRIVATE HEALTH INSURANCE | Attending: Family Medicine | Primary: Family Medicine

## 2020-11-22 DIAGNOSIS — E119 Type 2 diabetes mellitus without complications: Secondary | ICD-10-CM

## 2020-11-22 MED ORDER — METFORMIN SR 500 MG 24 HR TABLET
500 mg | ORAL_TABLET | Freq: Two times a day (BID) | ORAL | 1 refills | Status: AC
Start: 2020-11-22 — End: ?

## 2020-11-22 MED ORDER — OZEMPIC 0.25 MG OR 0.5 MG (2 MG/1.5 ML) SUBCUTANEOUS PEN INJECTOR
0.25 mg or 0.5 mg(2 mg/1.5 mL) | SUBCUTANEOUS | 1 refills | Status: AC
Start: 2020-11-22 — End: ?

## 2020-11-22 MED ORDER — LOSARTAN 25 MG TAB
25 mg | ORAL_TABLET | Freq: Every day | ORAL | 0 refills | Status: DC
Start: 2020-11-22 — End: 2021-03-02

## 2020-11-22 NOTE — Progress Notes (Signed)
 Progress Note    she is a 40 y.o. year old female who presents for evalution.      Assessment/ Plan:   Diagnoses and all orders for this visit:    1. Type 2 diabetes mellitus without complication, without long-term current use of insulin (HCC)  Assessment & Plan:  Fasting sugars reported to be well controlled on Ozempic  0.5 mg weekly and metformin  2 g daily  Refill medications  Assess A1c  Patient will contact [facility] to get us  a copy of her recent diabetic eye exam    Orders:  -     semaglutide  (Ozempic ) 0.25 mg or 0.5 mg/dose (2 mg/1.5 ml) subq pen; 0.5 mg by SubCUTAneous route every seven (7) days.  -     metFORMIN  ER (GLUCOPHAGE  XR) 500 mg tablet; Take 2 Tablets by mouth two (2) times a day.  -     METABOLIC PANEL, COMPREHENSIVE; Future  -     HEMOGLOBIN A1C WITH EAG; Future  -     LIPID PANEL; Future    2. Uncontrolled type 2 diabetes mellitus with hyperglycemia (HCC)  -     semaglutide  (Ozempic ) 0.25 mg or 0.5 mg/dose (2 mg/1.5 ml) subq pen; 0.5 mg by SubCUTAneous route every seven (7) days.    3. Controlled type 2 diabetes mellitus without complication, without long-term current use of insulin (HCC)  -     metFORMIN  ER (GLUCOPHAGE  XR) 500 mg tablet; Take 2 Tablets by mouth two (2) times a day.    4. Elevated BP without diagnosis of hypertension  Assessment & Plan:  Blood pressure mildly elevated  Reviewed use of ACE inhibitor's and ARB's for renal protection as well as blood pressure management  Shared decision making to start low-dose losartan     Orders:  -     losartan  (COZAAR ) 25 mg tablet; Take 1 Tablet by mouth daily.    5. Needs flu shot  -     INFLUENZA, FLUARIX, FLULAVAL, FLUZONE (AGE 56 MO+), AFLURIA(AGE 3Y+) IM, PF, 0.5 ML    6. Gastroesophageal reflux disease, unspecified whether esophagitis present  Assessment & Plan:  Well-controlled  Decreased medication use, likely due to dietary changes         Follow-up and Dispositions    Return in about 3 months (around 02/22/2021) for follow-up  diabetes.           I have discussed the diagnosis with the patient and the intended plan as seen in the above orders.    The patient has received an after-visit summary and questions were answered concerning future plans.   Pt conveyed understanding of plan.    Medication Side Effects and Warnings were discussed with patient        Subjective:     Chief Complaint   Patient presents with    Diabetes     BG has been high since she has been out of Ozempic  d/t insurance issues.     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  New insurance.  Ozempic  not covered.  Ran out of Ozempic  2 weeks ago.  Was using wife's until it ran out too.   Fasting sugars were 120-130 while on it consistently.  Was helping decrease appetite and cravings.   Lost about 15 lbs   1 meal and some snacks  Diabetic eye exam was done at [facility] in June    No home blood pressure  monitoring.  Mom with ACEi cough, no angioedema but patient concerned about that.  Open to trying losartan     Reflux well controlled  Taking rx prn, typically 1-2 times per week.    Wife is 56 years old.  She is going to try and get pregnant      Reviewed PmHx, RxHx, FmHx, SocHx, AllgHx and updated and dated in the chart.    Review of Systems - negative except as listed above in the HPI    Objective:     Vitals:    11/22/20 0812   BP: (!) 127/92   Pulse: (!) 112   Temp: 98.5 F (36.9 C)   SpO2: 99%   Weight: 241 lb 9.6 oz (109.6 kg)   Height: 5' 1 (1.549 m)       Current Outpatient Medications   Medication Sig    semaglutide  (Ozempic ) 0.25 mg or 0.5 mg/dose (2 mg/1.5 ml) subq pen 0.5 mg by SubCUTAneous route every seven (7) days.    metFORMIN  ER (GLUCOPHAGE  XR) 500 mg tablet Take 2 Tablets by mouth two (2) times a day.    losartan  (COZAAR ) 25 mg tablet Take 1 Tablet by mouth daily.    venlafaxine  (EFFEXOR ) 75 mg tablet Take 1 Tablet by mouth daily.    famotidine (PEPCID) 20 mg tablet Take 20 mg by mouth two (2)  times a day.    clindamycin (CLEOCIN T) 1 % external solution use thin film on affected area two times daily.    cholecalciferol (VITAMIN D3) (1000 Units /25 mcg) tablet Take  by mouth daily.    Ca comb no.1/vit D3/B6/FA/B12 (HEARTBURN & ACID REFLUX PO) Take  by mouth.    Aurovela Fe 1-20, 28, 1 mg-20 mcg (21)/75 mg (7) tab Take 1 Tablet by mouth daily.     No current facility-administered medications for this visit.       Physical Exam  Vitals and nursing note reviewed.   Constitutional:       General: She is not in acute distress.     Appearance: Normal appearance. She is obese. She is not ill-appearing, toxic-appearing or diaphoretic.   HENT:      Head: Normocephalic and atraumatic.   Eyes:      General: No scleral icterus.        Right eye: No discharge.         Left eye: No discharge.      Conjunctiva/sclera: Conjunctivae normal.   Cardiovascular:      Rate and Rhythm: Normal rate and regular rhythm.      Pulses: Normal pulses.      Heart sounds: Normal heart sounds.   Pulmonary:      Effort: Pulmonary effort is normal. No respiratory distress.      Breath sounds: Normal breath sounds.   Musculoskeletal:         General: No tenderness.      Cervical back: No rigidity or tenderness.      Right lower leg: No edema.      Left lower leg: No edema.   Lymphadenopathy:      Cervical: No cervical adenopathy.   Skin:     General: Skin is warm and dry.   Neurological:      General: No focal deficit present.      Mental Status: She is alert.   Psychiatric:         Mood and Affect: Mood normal.         Behavior:  Behavior normal.         Thought Content: Thought content normal.         Judgment: Judgment normal.            Damien Bene, MD

## 2020-11-22 NOTE — Progress Notes (Signed)
 Dana Terry is a 40 y.o. female , id x 2(name and DOB). Reviewed record, history, and  medications.    Chief Complaint   Patient presents with    Diabetes     BG has been high since she has been out of Ozempic  d/t insurance issues.     Medication Refill       Vitals:    11/22/20 0812   BP: (!) 127/92   Pulse: (!) 112   Temp: 98.5 F (36.9 C)   SpO2: 99%   Weight: 241 lb 9.6 oz (109.6 kg)   Height: 5' 1 (1.549 m)       Coordination of Care Questionnaire:   1. Have you been to the ER, urgent care clinic since your last visit?  Hospitalized since your last visit?No    2. Have you seen or consulted any other health care providers outside of the [facility] since your last visit?  Include any pap smears or colon screening. No      3 most recent PHQ Screens 03/21/2020   Little interest or pleasure in doing things Not at all   Feeling down, depressed, irritable, or hopeless Not at all   Total Score PHQ 2 0       Patient is accompanied by self I have received verbal consent from Dana Terry to discuss any/all medical information while they are present in the room.    After obtaining Dana Terry's consent, and per orders of Dr.Kluball, the vaccines ordered were given by Aloysius BROCKS Friend, LPN. Patient instructed to remain in clinic for 20 minutes afterwards, and to report any adverse reaction to me immediately. Patient did not display any adverse side effects.      Pt / caregiver given opportunity to review vaccine information sheet prior to vaccine administration. Opportunity given for questions and concerns. No questions or concerns at this time.

## 2020-11-22 NOTE — Assessment & Plan Note (Signed)
Well-controlled  Decreased medication use, likely due to dietary changes

## 2020-11-22 NOTE — Assessment & Plan Note (Signed)
Fasting sugars reported to be well controlled on Ozempic 0.5 mg weekly and metformin 2 g daily  Refill medications  Assess A1c  Patient will contact [facility] to get [place] a copy of her recent diabetic eye exam

## 2020-11-22 NOTE — Assessment & Plan Note (Signed)
Blood pressure mildly elevated  Reviewed use of ACE inhibitor's and ARB's for renal protection as well as blood pressure management  Shared decision making to start low-dose losartan

## 2020-11-22 NOTE — Progress Notes (Signed)
Lipids above goal, recommend statin.  A1c increased, diabetes uncontrolled.  Recommend starting GLP-1 agonist.  CMP with elevated glucose, otherwise normal.

## 2020-11-23 LAB — COMPREHENSIVE METABOLIC PANEL
ALT: 20 U/L (ref 12–78)
AST: 12 U/L — ABNORMAL LOW (ref 15–37)
Albumin/Globulin Ratio: 0.8 — ABNORMAL LOW (ref 1.1–2.2)
Albumin: 3.8 g/dL (ref 3.5–5.0)
Alkaline Phosphatase: 74 U/L (ref 45–117)
Anion Gap: 9 mmol/L (ref 5–15)
BUN/Creatinine Ratio: 16 (ref 12–20)
BUN: 16 MG/DL (ref 6–20)
CO2: 26 mmol/L (ref 21–32)
Calcium: 9.6 MG/DL (ref 8.5–10.1)
Chloride: 105 mmol/L (ref 97–108)
Creatinine: 1.01 MG/DL (ref 0.55–1.02)
Est, Glom Filt Rate: >60 mL/min/{1.73_m2} (ref >60)
Globulin: 4.5 g/dL — ABNORMAL HIGH (ref 2.0–4.0)
Glucose: 206 mg/dL — ABNORMAL HIGH (ref 65–100)
Potassium: 4.4 mmol/L (ref 3.5–5.1)
Sodium: 140 mmol/L (ref 136–145)
Total Bilirubin: 0.4 MG/DL (ref 0.2–1.0)
Total Protein: 8.3 g/dL — ABNORMAL HIGH (ref 6.4–8.2)

## 2020-11-23 LAB — LIPID PANEL
CHOL/HDL Ratio: 3 (ref 0.0–5.0)
Chol/HDL Ratio: 3 (ref 0.0–5.0)
Cholesterol, Total: 172 MG/DL (ref <200)
Cholesterol, total: 172 MG/DL (ref <200)
HDL Cholesterol: 57 MG/DL
HDL: 57 MG/DL
LDL Calculated: 93.6 MG/DL (ref 0–100)
LDL, calculated: 93.6 MG/DL (ref 0–100)
Triglyceride: 107 MG/DL (ref <150)
Triglycerides: 107 MG/DL (ref <150)
VLDL Cholesterol Calculated: 21.4 MG/DL
VLDL, calculated: 21.4 MG/DL

## 2020-11-23 LAB — HEMOGLOBIN A1C W/EAG
Estimated Avg Glucose: 174 mg/dL
Hemoglobin A1C: 7.7 % — ABNORMAL HIGH (ref 4.0–5.6)

## 2020-11-23 LAB — METABOLIC PANEL, COMPREHENSIVE
A-G Ratio: 0.8 — ABNORMAL LOW (ref 1.1–2.2)
ALT (SGPT): 20 U/L (ref 12–78)
AST (SGOT): 12 U/L — ABNORMAL LOW (ref 15–37)
Albumin: 3.8 g/dL (ref 3.5–5.0)
Alk. phosphatase: 74 U/L (ref 45–117)
Anion gap: 9 mmol/L (ref 5–15)
BUN/Creatinine ratio: 16 (ref 12–20)
BUN: 16 MG/DL (ref 6–20)
Bilirubin, total: 0.4 MG/DL (ref 0.2–1.0)
CO2: 26 mmol/L (ref 21–32)
Calcium: 9.6 MG/DL (ref 8.5–10.1)
Chloride: 105 mmol/L (ref 97–108)
Creatinine: 1.01 MG/DL (ref 0.55–1.02)
Globulin: 4.5 g/dL — ABNORMAL HIGH (ref 2.0–4.0)
Glucose: 206 mg/dL — ABNORMAL HIGH (ref 65–100)
Potassium: 4.4 mmol/L (ref 3.5–5.1)
Protein, total: 8.3 g/dL — ABNORMAL HIGH (ref 6.4–8.2)
Sodium: 140 mmol/L (ref 136–145)
eGFR: >60 mL/min/{1.73_m2} (ref >60)

## 2020-11-23 LAB — HEMOGLOBIN A1C WITH EAG
Est. average glucose: 174 mg/dL
Hemoglobin A1c: 7.7 % — ABNORMAL HIGH (ref 4.0–5.6)

## 2020-11-27 ENCOUNTER — Encounter

## 2020-12-01 MED ORDER — ATORVASTATIN 40 MG TAB
40 mg | ORAL_TABLET | Freq: Every day | ORAL | 1 refills | Status: AC
Start: 2020-12-01 — End: ?

## 2020-12-01 MED ORDER — GLIPIZIDE SR 2.5 MG 24 HR TAB
2.5 mg | ORAL_TABLET | Freq: Every day | ORAL | 1 refills | Status: AC
Start: 2020-12-01 — End: ?

## 2021-01-06 ENCOUNTER — Encounter

## 2021-01-26 ENCOUNTER — Encounter

## 2021-01-29 MED ORDER — VENLAFAXINE 75 MG TAB
75 mg | ORAL_TABLET | Freq: Every day | ORAL | 1 refills | Status: DC
Start: 2021-01-29 — End: 2021-02-05

## 2021-02-05 MED ORDER — VENLAFAXINE 75 MG TAB
75 mg | ORAL_TABLET | Freq: Every day | ORAL | 1 refills | Status: AC
Start: 2021-02-05 — End: ?

## 2021-03-02 MED ORDER — LOSARTAN 25 MG TAB
25 mg | ORAL_TABLET | Freq: Every day | ORAL | 1 refills | Status: AC
Start: 2021-03-02 — End: ?

## 2021-03-02 NOTE — Addendum Note (Signed)
 Addendum  Note by Nohemi Perkins, MD at 03/02/21 1312                Author: Nohemi Perkins, MD  Service: --  Author Type: Physician       Filed: 03/02/21 1312  Encounter Date: 01/06/2021  Status: Signed          Editor: Nohemi Perkins, MD (Physician)          Addended by: NOHEMI PERKINS on: 03/02/2021 01:12 PM    Modules accepted: Orders

## 2021-06-11 ENCOUNTER — Telehealth
Admit: 2021-06-11 | Discharge: 2021-06-11 | Payer: PRIVATE HEALTH INSURANCE | Attending: Family Medicine | Primary: Family Medicine

## 2021-06-11 DIAGNOSIS — E1165 Type 2 diabetes mellitus with hyperglycemia: Secondary | ICD-10-CM

## 2021-06-11 MED ORDER — OZEMPIC (1 MG/DOSE) 4 MG/3ML SC SOPN
4 | SUBCUTANEOUS | 2 refills | Status: DC
Start: 2021-06-11 — End: 2021-10-11

## 2021-06-11 NOTE — Progress Notes (Signed)
Dana Terry is a 41 y.o. female , id x 2(name and DOB). Reviewed questionnaires, and  medications.    Chief Complaint   Patient presents with    Diabetes       PHQ-9 Total Score: 0 (06/11/2021  8:15 AM)         1. Have you been to the ER, urgent care clinic since your last visit?  Hospitalized since your last visit?No    2. Have you seen or consulted any other health care providers outside of the [facility] since your last visit?  Include any pap smears or colon screening. No

## 2021-06-11 NOTE — Assessment & Plan Note (Signed)
A1c now will reflect both on glipizide+metformin and on ozempic+metformin (1 month)  If significantly elevated, plan to increase ozempic.  Encouraged to have copy of diabetic eye exam forwarded to PCP.

## 2021-06-11 NOTE — Progress Notes (Signed)
Progress Note    she is a 41 y.o. year old female who presents for evaluation Diabetes  .        Assessment/ Plan:     1. Type 2 diabetes mellitus with hyperglycemia, without long-term current use of insulin (HCC)  Assessment & Plan:  A1c now will reflect both on glipizide+metformin and on ozempic+metformin (1 month)  If significantly elevated, plan to increase ozempic.  Encouraged to have copy of diabetic eye exam forwarded to PCP.  Orders:  -     Semaglutide, 1 MG/DOSE, (OZEMPIC, 1 MG/DOSE,) 4 MG/3ML SOPN; Inject 1 mg into the skin once a week, Disp-3 mL, R-2Please obtain prior authorization using Dana Terry program.  Thank youNormal  -     Comprehensive Metabolic Panel; Future  -     Hemoglobin A1C; Future  -     Lipid Panel; Future  -     Microalbumin / Creatinine Urine Ratio; Future  2. Vitamin D deficiency  -     Vitamin D 25 Hydroxy; Future  3. Class 3 severe obesity due to excess calories with serious comorbidity and body mass index (BMI) of 45.0 to 49.9 in adult (HCC)  -     CBC; Future  -     Thyroid Cascade Profile; Future     Patient intends to schedule a lab appointment at [facility]    Return in about 3 months (around 09/11/2021) for follow-up diabetes with fasting labs.      I have discussed the diagnosis with the patient and the intended plan as seen in the above orders.    Medication Side Effects and Warnings were discussed with patient  The patient was instructed to access after-visit summary via mychart and questions were answered concerning future plans.    Patient conveyed understanding of plan.           Subjective:     Chief Complaint   Patient presents with    Diabetes     The patient was located at Home: [address]  [name] [phone]    Health reported as pretty good  Some insurance changes anticipated this month.  Should be expanding her options on medications.  Will also be switching to wife's insurance in November.    She used her wife's ozempic 1 mg, been on it  for about a month.  Fasting sugars running in the low 100s.  Still taking metformin as well.  Stopped glipizide when starting ozempic.  Previously fasting sugars in the 130s on glipizide + metformin.  No other sugar checks.  No recent glucometer calibration  She has lost weight while on ozempic.  Diabetic diet: generally good  Feet: daily checks, no issues  Neuropathy: no symptoms  Eye exam: tomorrow    Tolerating Lipitor well      PMHx, RxHx, FmHx, SocHx, AllgHx were reviewed and updated in the chart.    Review of Systems - negative except as noted above      Objective:   There were no vitals filed for this visit.    Current Outpatient Medications   Medication Sig    Semaglutide, 1 MG/DOSE, (OZEMPIC, 1 MG/DOSE,) 4 MG/3ML SOPN Inject 1 mg into the skin once a week    atorvastatin (LIPITOR) 40 MG tablet Take 1 tablet by mouth daily    vitamin D (CHOLECALCIFEROL) 25 MCG (1000 UT) TABS tablet Take by mouth daily    clindamycin (CLEOCIN T) 1 % external solution use  thin film on affected area two times daily.    famotidine (PEPCID) 20 MG tablet Take 1 tablet by mouth 2 times daily    glipiZIDE (GLUCOTROL XL) 2.5 MG extended release tablet Take 1 tablet by mouth daily    losartan (COZAAR) 25 MG tablet Take 1 tablet by mouth daily    metFORMIN (GLUCOPHAGE-XR) 500 MG extended release tablet Take 2 tablets by mouth 2 times daily    norethindrone-ethinyl estradiol (JUNEL FE 1/20) 1-20 MG-MCG per tablet Take 1 tablet by mouth daily    venlafaxine (EFFEXOR) 75 MG tablet Take 1 tablet by mouth daily     No current facility-administered medications for this visit.       Physical Exam  Nursing note reviewed.   Constitutional:       General: She is not in acute distress.     Appearance: Normal appearance. She is obese. She is not ill-appearing or toxic-appearing.   HENT:      Head: Normocephalic and atraumatic.   Eyes:      General:         Right eye: No discharge.         Left eye: No discharge.      Conjunctiva/sclera:  Conjunctivae normal.   Pulmonary:      Effort: Pulmonary effort is normal. No respiratory distress (able to speak in complete sentences).   Musculoskeletal:      Cervical back: No rigidity.   Neurological:      Mental Status: She is alert.      Comments: No obvious neurological deficits within the limits of virtual encounter.   Psychiatric:         Mood and Affect: Mood normal.         Behavior: Behavior normal.         Thought Content: Thought content normal.         Judgment: Judgment normal.             I was located in the office while conducting this encounter.    Total Time: minutes: 11-20 minutes      Dana Terry is a 40 y.o. female who was evaluated through a synchronous (real-time) audio-video encounter. The patient (or guardian if applicable) is aware that this is a billable service, which includes applicable co-pays. This Virtual Visit was conducted with patient's (and/or legal guardian's) consent. Patient identification was verified, and a caregiver was present when appropriate.     --Dana Lamb, MD on 06/11/21 at 8:29 AM EDT      An electronic signature was used to authenticate this note.

## 2021-06-14 ENCOUNTER — Encounter

## 2021-06-14 LAB — CBC
Hematocrit: 37.8 % (ref 35.0–47.0)
Hemoglobin: 12.1 g/dL (ref 11.5–16.0)
MCH: 27.8 PG (ref 26.0–34.0)
MCHC: 32 g/dL (ref 30.0–36.5)
MCV: 86.7 FL (ref 80.0–99.0)
MPV: 10.5 FL (ref 8.9–12.9)
Nucleated RBCs: 0 PER 100 WBC
Platelets: 324 10*3/uL (ref 150–400)
RBC: 4.36 M/uL (ref 3.80–5.20)
RDW: 14.4 % (ref 11.5–14.5)
WBC: 11.8 10*3/uL — ABNORMAL HIGH (ref 3.6–11.0)
nRBC: 0 10*3/uL (ref 0.00–0.01)

## 2021-06-14 LAB — LIPID PANEL
Chol/HDL Ratio: 1.9 (ref 0.0–5.0)
Cholesterol, Total: 100 MG/DL (ref <200)
HDL: 52 MG/DL
LDL Calculated: 34 MG/DL (ref 0–100)
Triglycerides: 70 MG/DL (ref <150)
VLDL Cholesterol Calculated: 14 MG/DL

## 2021-06-14 LAB — COMPREHENSIVE METABOLIC PANEL
ALT: 22 U/L (ref 12–78)
AST: 16 U/L (ref 15–37)
Albumin/Globulin Ratio: 1 — ABNORMAL LOW (ref 1.1–2.2)
Albumin: 3.8 g/dL (ref 3.5–5.0)
Alk Phosphatase: 69 U/L (ref 45–117)
Anion Gap: 10 mmol/L (ref 5–15)
BUN/Creatinine Ratio: 18 (ref 12–20)
BUN: 15 MG/DL (ref 6–20)
CO2: 25 mmol/L (ref 21–32)
Calcium: 9.9 MG/DL (ref 8.5–10.1)
Chloride: 105 mmol/L (ref 97–108)
Creatinine: 0.85 MG/DL (ref 0.55–1.02)
Est, Glom Filt Rate: >60 mL/min/{1.73_m2} (ref >60)
Globulin: 3.8 g/dL (ref 2.0–4.0)
Glucose: 160 mg/dL — ABNORMAL HIGH (ref 65–100)
Potassium: 4.2 mmol/L (ref 3.5–5.1)
Sodium: 140 mmol/L (ref 136–145)
Total Bilirubin: 0.5 MG/DL (ref 0.2–1.0)
Total Protein: 7.6 g/dL (ref 6.4–8.2)

## 2021-06-15 LAB — VITAMIN D 25 HYDROXY: Vit D, 25-Hydroxy: 42.7 ng/mL (ref 30–100)

## 2021-06-15 LAB — HEMOGLOBIN A1C
Estimated Avg Glucose: 154 mg/dL
Hemoglobin A1C: 7 % — ABNORMAL HIGH (ref 4.0–5.6)

## 2021-06-16 LAB — THYROID CASCADE PROFILE: TSH: 1.88 u[IU]/mL (ref 0.450–4.500)

## 2021-06-29 MED ORDER — ATORVASTATIN CALCIUM 40 MG PO TABS
40 | ORAL_TABLET | ORAL | 1 refills | Status: DC
Start: 2021-06-29 — End: 2022-02-18

## 2021-06-29 MED ORDER — GLIPIZIDE ER 2.5 MG PO TB24
2.5 | ORAL_TABLET | ORAL | 1 refills | Status: DC
Start: 2021-06-29 — End: 2022-03-13

## 2021-07-19 ENCOUNTER — Encounter

## 2021-07-31 MED ORDER — SLYND 4 MG PO TABS
4 | ORAL_TABLET | Freq: Every day | ORAL | 5 refills | Status: DC
Start: 2021-07-31 — End: 2021-12-03

## 2021-08-11 MED ORDER — METFORMIN HCL ER 500 MG PO TB24
500 | ORAL_TABLET | ORAL | 1 refills | Status: DC
Start: 2021-08-11 — End: 2022-03-13

## 2021-08-25 MED ORDER — VALACYCLOVIR HCL 1 G PO TABS
1 | ORAL_TABLET | ORAL | 3 refills | Status: DC
Start: 2021-08-25 — End: 2021-12-03

## 2021-08-27 MED ORDER — LOSARTAN POTASSIUM 25 MG PO TABS
25 | ORAL_TABLET | ORAL | 0 refills | Status: DC
Start: 2021-08-27 — End: 2021-12-03

## 2021-09-17 NOTE — Progress Notes (Signed)
[facility] mamm[facility] / notes report was put on Dr Clerance Lav desk

## 2021-10-09 ENCOUNTER — Ambulatory Visit: Admit: 2021-10-09 | Discharge: 2021-10-09 | Payer: PRIVATE HEALTH INSURANCE | Attending: Family Medicine

## 2021-10-09 VITALS — BP 128/87 | HR 104 | Temp 99.30000°F | Ht 61.0 in | Wt 240.0 lb

## 2021-10-09 DIAGNOSIS — E119 Type 2 diabetes mellitus without complications: Principal | ICD-10-CM

## 2021-10-09 NOTE — Patient Instructions (Addendum)
Track what you are eating and drinking - everything!  :)  Use an app such as myfitnesspal or a paper notebook.  Aim to keep your carbohydrates low, under 100, then 80, then 60 grams daily.  Do your best to primarily drink water.  When eating, do not multitask.  Pay attention to food and drink with all your senses.  When you eat out, eat just half of it and wait 10 minutes before eating any more if you are hungry.  [email or web address]    Exercise recommendations  150 minutes of moderate intensity cardio exercise in the week  3 days of total body strength training  Work on stretching/flexibility as well.  It's great to get outside!  But you can also check out youtube for fun videos and workouts.  I like yoga with adriene    Request diabetic eye exam records

## 2021-10-09 NOTE — Assessment & Plan Note (Signed)
Negative sleep study 11/2019  With worsening sleep and fatigue, suspect sleep apnea  Plan to obtain sleep study after new insurance obtained

## 2021-10-09 NOTE — Assessment & Plan Note (Signed)
Borderline controlled at last check, reassess with labs  When insurance kicks in plan to increase Ozempic to 1.7 mg weekly  Diabetic diet encouraged  Normal diabetic foot exam today

## 2021-10-09 NOTE — Progress Notes (Signed)
Progress Note    she is a 41 y.o. year old female who presents for evaluation of Lab Collection and Diabetes        Assessment/ Plan:     1. Type 2 diabetes mellitus without complication, without long-term current use of insulin (HCC)  Assessment & Plan:  Borderline controlled at last check, reassess with labs  When insurance kicks in plan to increase Ozempic to 1.7 mg weekly  Diabetic diet encouraged  Normal diabetic foot exam today  Orders:  -     Comprehensive Metabolic Panel; Future  -     Hemoglobin A1C; Future  -     Lipid Panel; Future  -     HM DIABETES FOOT EXAM  -     Microalbumin / Creatinine Urine Ratio; Future  2. Screening mammogram for breast cancer  -     MAM DIGITAL SCREEN W OR WO CAD BILATERAL; Future  3. Snoring  Assessment & Plan:  Negative sleep study 11/2019  With worsening sleep and fatigue, suspect sleep apnea  Plan to obtain sleep study after new insurance obtained      Patient is fasting for labs today.    Return in about 4 months (around 02/09/2022) for follow-up diabetes with labs.      I have discussed the diagnosis with the patient and the intended plan as seen in the above orders.    The patient has received an after-visit summary and questions were answered concerning future plans.   Pt conveyed understanding of plan.    Medication Side Effects and Warnings were discussed with patient        Subjective:     Chief Complaint   Patient presents with    Lab Collection    Diabetes     Ozempic denied by insurance.  She will get on her wife's insurance in November and will be able to get a prescription then.  She reports only feeling hungry from 1 meal a day, will typically snack once a day   Currently traveling a lot for work, staying at hotels  She reports eye exam is up-to-date    Her wife will be trying to get pregnant soon.    She is due for a mammogram  Reports previous dense tissue  Previous genetic testing low risk for breast cancer.      Reviewed PmHx, RxHx, FmHx, SocHx, AllgHx and  updated and dated in the chart.    Review of Systems - negative except as listed above in the HPI    Objective:     Vitals:    10/09/21 0733   BP: 128/87   Pulse: (!) 104   Temp: 99.3 F (37.4 C)   SpO2: 99%   Weight: 240 lb (108.9 kg)   Height: 5\' 1"  (1.549 m)       Current Outpatient Medications   Medication Sig    losartan (COZAAR) 25 MG tablet TAKE ONE TABLET BY MOUTH DAILY    valACYclovir (VALTREX) 1 g tablet TAKE 2 TABLETS BY MOUTH TWICE DAILY FOR INITIAL OUTBREAK SYMPTOM    metFORMIN (GLUCOPHAGE-XR) 500 MG extended release tablet TAKE TWO TABLETS BY MOUTH TWO TIMES A DAY    Drospirenone (SLYND) 4 MG TABS Take 4 mg by mouth daily    atorvastatin (LIPITOR) 40 MG tablet TAKE ONE TABLET BY MOUTH DAILY    glipiZIDE (GLUCOTROL XL) 2.5 MG extended release tablet TAKE ONE TABLET BY MOUTH DAILY    Semaglutide, 1 MG/DOSE, (OZEMPIC, 1  MG/DOSE,) 4 MG/3ML SOPN Inject 1 mg into the skin once a week    vitamin D (CHOLECALCIFEROL) 25 MCG (1000 UT) TABS tablet Take by mouth daily    clindamycin (CLEOCIN T) 1 % external solution use thin film on affected area two times daily.    famotidine (PEPCID) 20 MG tablet Take 1 tablet by mouth 2 times daily    venlafaxine (EFFEXOR) 75 MG tablet Take 1 tablet by mouth daily     No current facility-administered medications for this visit.       Physical Exam  Vitals and nursing note reviewed.   Constitutional:       General: She is not in acute distress.     Appearance: Normal appearance. She is obese. She is not ill-appearing or toxic-appearing.   HENT:      Head: Normocephalic and atraumatic.   Eyes:      General:         Right eye: No discharge.         Left eye: No discharge.      Conjunctiva/sclera: Conjunctivae normal.   Cardiovascular:      Rate and Rhythm: Normal rate and regular rhythm.      Pulses: Normal pulses.           Dorsalis pedis pulses are 2+ on the right side and 2+ on the left side.        Posterior tibial pulses are 2+ on the right side and 2+ on the left side.       Heart sounds: Normal heart sounds.   Pulmonary:      Effort: Pulmonary effort is normal. No respiratory distress.      Breath sounds: Normal breath sounds.   Musculoskeletal:         General: No tenderness.      Cervical back: No rigidity.      Right lower leg: No edema.      Left lower leg: No edema.      Right foot: Normal range of motion. No deformity.      Left foot: Normal range of motion. No deformity.   Feet:      Right foot:      Protective Sensation: 10 sites tested.  10 sites sensed.      Skin integrity: Skin integrity normal.      Toenail Condition: Right toenails are normal.      Left foot:      Protective Sensation: 10 sites tested.  10 sites sensed.      Skin integrity: Skin integrity normal.      Toenail Condition: Left toenails are normal.   Skin:     General: Skin is warm and dry.      Findings: No rash (over visualized areas).   Neurological:      General: No focal deficit present.      Mental Status: She is alert.   Psychiatric:         Mood and Affect: Mood normal.         Behavior: Behavior normal.         Thought Content: Thought content normal.         Judgment: Judgment normal.              Valda Lamb, MD

## 2021-10-09 NOTE — Progress Notes (Signed)
Dana Terry is a 41 y.o. female , id x 2(name and DOB). Reviewed record, history, and  medications.    Chief Complaint   Patient presents with    Lab Collection    Diabetes       Vitals:    10/09/21 0733   BP: 128/87   Pulse: (!) 104   Temp: 99.3 F (37.4 C)   SpO2: 99%   Weight: 240 lb (108.9 kg)   Height: 5\' 1"  (1.549 m)       Coordination of Care Questionnaire:   1. Have you been to the ER, urgent care clinic since your last visit?  Hospitalized since your last visit?No    2. Have you seen or consulted any other health care providers outside of the [facility] since your last visit?  Include any pap smears or colon screening. No          06/11/2021     8:15 AM   PHQ-9    Little interest or pleasure in doing things 0   Feeling down, depressed, or hopeless 0   PHQ-2 Score 0   PHQ-9 Total Score 0            No data to display                Social Determinants of Health     Tobacco Use: Low Risk  (10/09/2021)    Patient History     Smoking Tobacco Use: Never     Smokeless Tobacco Use: Never     Passive Exposure: Not on file   Alcohol Use: Not on file   Financial Resource Strain: Low Risk  (10/07/2021)    Overall Financial Resource Strain (CARDIA)     Difficulty of Paying Living Expenses: Not hard at all   Food Insecurity: No Food Insecurity (10/07/2021)    Hunger Vital Sign     Worried About Running Out of Food in the Last Year: Never true     [place] in the Last Year: Never true   Transportation Needs: Unknown (10/07/2021)    PRAPARE - Armed forces logistics/support/administrative officer (Medical): Not on file     Lack of Transportation (Non-Medical): No   Physical Activity: Not on file   Stress: Not on file   Social Connections: Not on file   Intimate Partner Violence: Not on file   Depression: Not at risk (06/11/2021)    PHQ-2     PHQ-2 Score: 0   Housing Stability: Unknown (10/07/2021)    Housing Stability Vital Sign     Unable to Pay for Housing in the Last Year: Not on file     Number of Places Lived in  the Last Year: Not on file     Unstable Housing in the Last Year: No

## 2021-10-10 LAB — COMPREHENSIVE METABOLIC PANEL
ALT: 13 IU/L (ref 0–32)
AST: 16 IU/L (ref 0–40)
Albumin/Globulin Ratio: 1.3 (ref 1.2–2.2)
Albumin: 4.2 g/dL (ref 3.9–4.9)
Alkaline Phosphatase: 66 IU/L (ref 44–121)
BUN/Creatinine Ratio: 17 (ref 9–23)
BUN: 13 mg/dL (ref 6–24)
CO2: 22 mmol/L (ref 20–29)
Calcium: 9.5 mg/dL (ref 8.7–10.2)
Chloride: 102 mmol/L (ref 96–106)
Creatinine: 0.77 mg/dL (ref 0.57–1.00)
Est, Glom Filt Rate: 99 mL/min/{1.73_m2} (ref >59)
Globulin, Total: 3.2 g/dL (ref 1.5–4.5)
Glucose: 152 mg/dL — ABNORMAL HIGH (ref 70–99)
Potassium: 3.9 mmol/L (ref 3.5–5.2)
Sodium: 139 mmol/L (ref 134–144)
Total Bilirubin: 0.3 mg/dL (ref 0.0–1.2)
Total Protein: 7.4 g/dL (ref 6.0–8.5)

## 2021-10-10 LAB — HEMOGLOBIN A1C W/EAG
Estimated Avg Glucose: 154 mg/dL
Hemoglobin A1C: 7 % — ABNORMAL HIGH (ref 4.8–5.6)

## 2021-10-10 LAB — CARDIOVASCULAR REPORT

## 2021-10-10 LAB — LIPID PANEL
Cholesterol: 100 mg/dL (ref 100–199)
HDL: 45 mg/dL (ref >39)
LDL Calculated: 38 mg/dL (ref 0–99)
Triglycerides: 83 mg/dL (ref 0–149)
VLDL Cholesterol Calculated: 17 mg/dL (ref 5–40)

## 2021-10-10 LAB — ALBUMIN/CREATININE RATIO, URINE
Albumin, U: 48.1 ug/mL
Albumin/Creatinine Ratio: 14 mg/g{creat} (ref 0–29)
Creatinine, Ur: 333.3 mg/dL

## 2021-10-11 ENCOUNTER — Encounter

## 2021-10-11 MED ORDER — SEMAGLUTIDE (2 MG/DOSE) 8 MG/3ML SC SOPN
8 | SUBCUTANEOUS | 3 refills | Status: DC
Start: 2021-10-11 — End: 2022-03-13

## 2021-10-29 MED ORDER — VENLAFAXINE HCL 75 MG PO TABS
75 | ORAL_TABLET | Freq: Every day | ORAL | 1 refills | Status: DC
Start: 2021-10-29 — End: 2022-03-13

## 2021-12-03 ENCOUNTER — Encounter

## 2021-12-03 MED ORDER — LOSARTAN POTASSIUM 25 MG PO TABS
25 | ORAL_TABLET | Freq: Every day | ORAL | 1 refills | Status: DC
Start: 2021-12-03 — End: 2022-03-13

## 2021-12-03 MED ORDER — SLYND 4 MG PO TABS
4 | ORAL_TABLET | Freq: Every day | ORAL | 5 refills | Status: DC
Start: 2021-12-03 — End: 2022-03-13

## 2021-12-03 MED ORDER — VALACYCLOVIR HCL 1 G PO TABS
1 | ORAL_TABLET | Freq: Two times a day (BID) | ORAL | 5 refills | Status: AC
Start: 2021-12-03 — End: 2021-12-04

## 2021-12-03 NOTE — Telephone Encounter (Signed)
From: Annie Main  To: Office of Dr. Kem Kays  Sent: 12/02/2021 7:56 PM EST  Subject: Medication Renewal Request    Refills have been requested for the following medications:     valACYclovir (VALTREX) 1 g tablet [Dr. Kem Kays, MD]    Preferred pharmacy: [place], [place] - [address] [place]

## 2021-12-03 NOTE — Telephone Encounter (Signed)
From: Annie Main  To: Office of Dr. Kem Kays  Sent: 12/02/2021 11:22 AM EST  Subject: Medication Renewal Request    Refills have been requested for the following medications:     losartan (COZAAR) 25 MG tablet [Dr. Kem Kays, MD]    Preferred pharmacy: [place], [place] - [address] [place]

## 2021-12-03 NOTE — Telephone Encounter (Signed)
From: Annie Main  To: Office of Dr. Kem Kays  Sent: 12/02/2021 11:23 AM EST  Subject: Medication Renewal Request    Refills have been requested for the following medications:     Drospirenone (SLYND) 4 MG TABS [Dr. Kem Kays, MD]    Preferred pharmacy: [place], [place] - [address] [place]

## 2021-12-17 MED ORDER — VALACYCLOVIR HCL 1 G PO TABS
1 | ORAL_TABLET | ORAL | 3 refills | Status: DC
Start: 2021-12-17 — End: 2022-02-01

## 2022-01-15 NOTE — Telephone Encounter (Signed)
PA for Ozempic was submitted to [employer] via cover my meds PA# [id number], awaiting response.

## 2022-02-01 MED ORDER — VALACYCLOVIR HCL 1 G PO TABS
1 | ORAL_TABLET | ORAL | 3 refills | Status: DC
Start: 2022-02-01 — End: 2022-05-08

## 2022-02-18 MED ORDER — ATORVASTATIN CALCIUM 40 MG PO TABS
40 | ORAL_TABLET | ORAL | 1 refills | Status: DC
Start: 2022-02-18 — End: 2022-10-29

## 2022-03-13 ENCOUNTER — Ambulatory Visit: Admit: 2022-03-13 | Discharge: 2022-03-13 | Payer: PRIVATE HEALTH INSURANCE | Attending: Family Medicine

## 2022-03-13 DIAGNOSIS — F419 Anxiety disorder, unspecified: Secondary | ICD-10-CM

## 2022-03-13 DIAGNOSIS — F32A Depression, unspecified: Secondary | ICD-10-CM

## 2022-03-13 MED ORDER — SEMAGLUTIDE(0.25 OR 0.5MG/DOS) 2 MG/1.5ML SC SOPN
2 | SUBCUTANEOUS | 1 refills | Status: DC
Start: 2022-03-13 — End: 2022-04-28
  Filled 2022-03-14: qty 3, 56d supply, fill #0

## 2022-03-13 MED ORDER — GLIPIZIDE ER 2.5 MG PO TB24
2.5 | ORAL_TABLET | Freq: Every day | ORAL | 1 refills | Status: DC
Start: 2022-03-13 — End: 2022-06-10

## 2022-03-13 MED ORDER — VENLAFAXINE HCL 100 MG PO TABS
100 | ORAL_TABLET | Freq: Every day | ORAL | 1 refills | Status: DC
Start: 2022-03-13 — End: 2022-09-10

## 2022-03-13 MED ORDER — METFORMIN HCL ER 500 MG PO TB24
500 MG | ORAL_TABLET | ORAL | 1 refills | Status: DC
Start: 2022-03-13 — End: 2023-03-03

## 2022-03-13 MED ORDER — LOSARTAN POTASSIUM 25 MG PO TABS
25 | ORAL_TABLET | Freq: Every day | ORAL | 1 refills | Status: DC
Start: 2022-03-13 — End: 2022-06-10

## 2022-03-13 MED ORDER — SLYND 4 MG PO TABS
4 | ORAL_TABLET | Freq: Every day | ORAL | 3 refills | Status: DC
Start: 2022-03-13 — End: 2022-10-02

## 2022-03-13 NOTE — Progress Notes (Unsigned)
Chief Complaint   Patient presents with    Anxiety    Depression         "Have you been to the ER, urgent care clinic since your last visit?  Hospitalized since your last visit?"    NO    "Have you seen or consulted any other health care providers outside of [facility] since your last visit?"    NO       Have you had a mammogram?"   NO, Patient will schedule, Need to make sure insurance is accepted at location.

## 2022-03-13 NOTE — Progress Notes (Signed)
Progress Note    she is a 42 y.o. year old female who presents for evaluation of Anxiety and Depression        Assessment/ Plan:     1. Anxiety and depression  Uncontrolled, increase Effexor  -     venlafaxine (EFFEXOR) 100 MG tablet; Take 1 tablet by mouth daily, Disp-90 tablet, R-1Normal  2. Irregular menses  Well-controlled, continue current medication  -     Drospirenone (SLYND) 4 MG TABS; Take 4 mg by mouth daily, Disp-84 tablet, R-3Normal  3. Primary hypertension  Well-controlled, continue current medication  -     losartan (COZAAR) 25 MG tablet; Take 1 tablet by mouth daily, Disp-90 tablet, R-1Normal  -     TSH; Future  -     CBC; Future  4. Type 2 diabetes mellitus with hyperglycemia, without long-term current use of insulin ([facility])  Restart Ozempic if insurance allows  Continue current medications pending labs  -     glipiZIDE (GLUCOTROL XL) 2.5 MG extended release tablet; Take 1 tablet by mouth daily, Disp-90 tablet, R-1Normal  -     metFORMIN (GLUCOPHAGE-XR) 500 MG extended release tablet; TAKE TWO TABLETS BY MOUTH TWO TIMES A DAY, Disp-360 tablet, R-1Normal  -     Microalbumin / Creatinine Urine Ratio; Future  -     Comprehensive Metabolic Panel; Future  -     Lipid Panel; Future  -     Hemoglobin A1C; Future  -     Semaglutide,0.25 or 0.'5MG'$ /DOS, 2 MG/1.5ML SOPN; Inject 0.25 mg into the skin once a week, Disp-3 mL, R-1Normal      Return in about 1 month (around 04/13/2022) for follow-up mood.      I have discussed the diagnosis with the patient and the intended plan as seen in the above orders.    The patient has received an after-visit summary and questions were answered concerning future plans.   Pt conveyed understanding of plan.    Medication Side Effects and Warnings were discussed with patient        Subjective:     Chief Complaint   Patient presents with    Anxiety    Depression     Chart review  Last seen by me 10/09/2021  Last labs:  A1c unchanged, diabetes borderline uncontrolled.  Increase  Ozempic as discussed in office and recheck in 3 months.  Elevated glucose, otherwise normal CMP  No microalbuminuria  LDL at goal, continue Lipitor 40 mg daily  No mood med adjustments in the last year    Diabetes  She is off of ozempic, ran out of it a week ago  She was on 1 mg inconsistently  Estimates 6 doses in the last 3 months, all 1 mg  Issues with insurance coverage    Mood  Increased stress  Working out of town, in [place] so she rents a place and is only home on weekends  Family stress  Effexor long term  If she misses a dose she gets brain zaps  No side effects  It works best of anything she's tried in the past.        Reviewed PmHx, RxHx, FmHx, SocHx, AllgHx and updated and dated in the chart.    Review of Systems - negative except as listed above in the HPI    Objective:     Vitals:    03/13/22 1023   BP: 119/89   Site: Left Upper Arm   Position: Sitting  Cuff Size: Large Adult   Pulse: 96   Resp: 18   Temp: 97.7 F (36.5 C)   TempSrc: Oral   SpO2: 97%   Weight: 110.2 kg (243 lb)   Height: 1.524 m (5')       Current Outpatient Medications   Medication Sig    venlafaxine (EFFEXOR) 100 MG tablet Take 1 tablet by mouth daily    Drospirenone (SLYND) 4 MG TABS Take 4 mg by mouth daily    glipiZIDE (GLUCOTROL XL) 2.5 MG extended release tablet Take 1 tablet by mouth daily    losartan (COZAAR) 25 MG tablet Take 1 tablet by mouth daily    metFORMIN (GLUCOPHAGE-XR) 500 MG extended release tablet TAKE TWO TABLETS BY MOUTH TWO TIMES A DAY    Semaglutide,0.25 or 0.'5MG'$ /DOS, 2 MG/1.5ML SOPN Inject 0.25 mg into the skin once a week    atorvastatin (LIPITOR) 40 MG tablet TAKE ONE TABLET BY MOUTH DAILY    valACYclovir (VALTREX) 1 g tablet TAKE 2 TABLETS BY MOUTH TWICE DAILY FOR INITIAL OUTBREAK    vitamin D (CHOLECALCIFEROL) 25 MCG (1000 UT) TABS tablet Take by mouth daily    clindamycin (CLEOCIN T) 1 % external solution use thin film on affected area two times daily.    famotidine (PEPCID) 20 MG tablet Take 1  tablet by mouth 2 times daily     No current facility-administered medications for this visit.       Physical Exam  Vitals and nursing note reviewed.   Constitutional:       General: She is not in acute distress.     Appearance: Normal appearance. She is obese. She is not ill-appearing or toxic-appearing.   HENT:      Head: Normocephalic and atraumatic.   Eyes:      General:         Right eye: No discharge.         Left eye: No discharge.      Conjunctiva/sclera: Conjunctivae normal.   Cardiovascular:      Rate and Rhythm: Normal rate and regular rhythm.      Pulses: Normal pulses.      Heart sounds: Normal heart sounds.   Pulmonary:      Effort: Pulmonary effort is normal. No respiratory distress.      Breath sounds: Normal breath sounds.   Musculoskeletal:         General: No tenderness.      Cervical back: No rigidity.      Right lower leg: No edema.      Left lower leg: No edema.   Skin:     General: Skin is warm and dry.      Findings: No rash (over visualized areas).   Neurological:      General: No focal deficit present.      Mental Status: She is alert.   Psychiatric:         Mood and Affect: Mood normal.         Behavior: Behavior normal.         Thought Content: Thought content normal.         Judgment: Judgment normal.              Kem Kays, MD

## 2022-03-14 LAB — COMPREHENSIVE METABOLIC PANEL
ALT: 16 U/L (ref 12–78)
AST: 11 U/L — ABNORMAL LOW (ref 15–37)
Albumin/Globulin Ratio: 0.9 — ABNORMAL LOW (ref 1.1–2.2)
Albumin: 3.8 g/dL (ref 3.5–5.0)
Alk Phosphatase: 75 U/L (ref 45–117)
Anion Gap: 5 mmol/L (ref 5–15)
BUN/Creatinine Ratio: 18 (ref 12–20)
BUN: 16 MG/DL (ref 6–20)
CO2: 25 mmol/L (ref 21–32)
Calcium: 9.9 MG/DL (ref 8.5–10.1)
Chloride: 107 mmol/L (ref 97–108)
Creatinine: 0.89 MG/DL (ref 0.55–1.02)
Est, Glom Filt Rate: >60 mL/min/{1.73_m2} (ref >60)
Globulin: 4.3 g/dL — ABNORMAL HIGH (ref 2.0–4.0)
Glucose: 113 mg/dL — ABNORMAL HIGH (ref 65–100)
Potassium: 4.4 mmol/L (ref 3.5–5.1)
Sodium: 137 mmol/L (ref 136–145)
Total Bilirubin: 0.4 MG/DL (ref 0.2–1.0)
Total Protein: 8.1 g/dL (ref 6.4–8.2)

## 2022-03-14 LAB — HEMOGLOBIN A1C
Estimated Avg Glucose: 163 mg/dL
Hemoglobin A1C: 7.3 % — ABNORMAL HIGH (ref 4.0–5.6)

## 2022-03-14 LAB — CBC
Hematocrit: 38.2 % (ref 35.0–47.0)
Hemoglobin: 12.6 g/dL (ref 11.5–16.0)
MCH: 28.6 PG (ref 26.0–34.0)
MCHC: 33 g/dL (ref 30.0–36.5)
MCV: 86.6 FL (ref 80.0–99.0)
MPV: 10.4 FL (ref 8.9–12.9)
Nucleated RBCs: 0 PER 100 WBC
Platelets: 317 10*3/uL (ref 150–400)
RBC: 4.41 M/uL (ref 3.80–5.20)
RDW: 14.6 % — ABNORMAL HIGH (ref 11.5–14.5)
WBC: 11.5 10*3/uL — ABNORMAL HIGH (ref 3.6–11.0)
nRBC: 0 10*3/uL (ref 0.00–0.01)

## 2022-03-14 LAB — LIPID PANEL
Chol/HDL Ratio: 1.9 (ref 0.0–5.0)
Cholesterol, Total: 105 MG/DL (ref <200)
HDL: 54 MG/DL
LDL Calculated: 36 MG/DL (ref 0–100)
Triglycerides: 75 MG/DL (ref <150)
VLDL Cholesterol Calculated: 15 MG/DL

## 2022-03-14 LAB — TSH: TSH, 3rd Generation: 0.93 u[IU]/mL (ref 0.36–3.74)

## 2022-04-28 ENCOUNTER — Encounter

## 2022-04-28 MED ORDER — SEMAGLUTIDE(0.25 OR 0.5MG/DOS) 2 MG/3ML SC SOPN
2 | SUBCUTANEOUS | 0 refills | Status: DC
Start: 2022-04-28 — End: 2022-05-20

## 2022-04-28 NOTE — Telephone Encounter (Signed)
From: Deeann Cree  To: Office of Dr. Valda Lamb  Sent: 04/26/2022 1:54 PM EDT  Subject: Medication Renewal Request    Refills have been requested for the following medications:     Semaglutide,0.25 or 0.5MG /DOS, 2 MG/1.5ML SOPN [Dr. Valda Lamb, MD]   Patient Comment: Need to switch to 0.5mg     Preferred pharmacy: [facility] - [place], [place] - [address] - P [phone] - F [phone]

## 2022-05-08 MED ORDER — VALACYCLOVIR HCL 1 G PO TABS
1 g | ORAL_TABLET | ORAL | 3 refills | Status: DC
Start: 2022-05-08 — End: 2023-02-03

## 2022-05-16 MED FILL — OZEMPIC (0.25 OR 0.5 MG/DOSE 2 SOPN: 2 2 MG/3ML | SUBCUTANEOUS | 56 days supply | Qty: 3 | Fill #1 | Status: AC

## 2022-05-16 NOTE — Telephone Encounter (Signed)
Called pt in regards to her mychart apt request for med refill, lvm to call us back.

## 2022-05-17 ENCOUNTER — Encounter

## 2022-05-20 MED ORDER — OZEMPIC (0.25 OR 0.5 MG/DOSE) 2 MG/3ML SC SOPN
2 | SUBCUTANEOUS | 0 refills | Status: DC
Start: 2022-05-20 — End: 2022-06-28
  Filled 2022-06-27: qty 3, 56d supply, fill #0

## 2022-05-21 NOTE — Telephone Encounter (Signed)
error 

## 2022-05-27 NOTE — Telephone Encounter (Signed)
LVM for Patient to call back to confirm form we received for her Birth Control refill through the [facility].

## 2022-05-28 NOTE — Telephone Encounter (Signed)
Left another message for the patient about her script request from [facility].

## 2022-06-10 ENCOUNTER — Encounter

## 2022-06-10 MED ORDER — GLIPIZIDE ER 2.5 MG PO TB24
2.5 | ORAL_TABLET | Freq: Every day | ORAL | 1 refills | Status: DC
Start: 2022-06-10 — End: 2022-10-29

## 2022-06-10 MED ORDER — LOSARTAN POTASSIUM 25 MG PO TABS
25 | ORAL_TABLET | Freq: Every day | ORAL | 1 refills | Status: DC
Start: 2022-06-10 — End: 2022-09-10

## 2022-06-10 NOTE — Telephone Encounter (Signed)
From: Deeann Cree  To: Office of Dr. Valda Lamb  Sent: 06/09/2022 12:31 PM EDT  Subject: Medication Renewal Request    Refills have been requested for the following medications:     glipiZIDE (GLUCOTROL XL) 2.5 MG extended release tablet [Dr. Valda Lamb, MD]     losartan (COZAAR) 25 MG tablet [Dr. Valda Lamb, MD]    Preferred pharmacy: [facility] - [place], [place] - [address] - P [phone] - F [phone]

## 2022-06-27 ENCOUNTER — Encounter

## 2022-06-28 MED ORDER — OZEMPIC (0.25 OR 0.5 MG/DOSE) 2 MG/3ML SC SOPN
2 | SUBCUTANEOUS | 0 refills | Status: DC
Start: 2022-06-28 — End: 2022-07-22

## 2022-07-22 ENCOUNTER — Telehealth: Admit: 2022-07-22 | Discharge: 2022-07-22 | Payer: PRIVATE HEALTH INSURANCE | Attending: Family Medicine

## 2022-07-22 ENCOUNTER — Encounter

## 2022-07-22 DIAGNOSIS — E119 Type 2 diabetes mellitus without complications: Principal | ICD-10-CM

## 2022-07-22 MED ORDER — SEMAGLUTIDE(0.25 OR 0.5MG/DOS) 2 MG/3ML SC SOPN
23 MG/3ML | SUBCUTANEOUS | 1 refills | Status: DC
Start: 2022-07-22 — End: 2023-03-03

## 2022-07-22 MED ORDER — OZEMPIC (1 MG/DOSE) 4 MG/3ML SC SOPN
43 MG/3ML | SUBCUTANEOUS | 1 refills | Status: DC
Start: 2022-07-22 — End: 2023-03-03
  Filled 2022-07-22: qty 3, 28d supply, fill #0

## 2022-07-22 NOTE — Progress Notes (Signed)
Progress Note    she is a 42 y.o. year old female who presents for evaluation Weight Management (Patient is doing well on Ozempic. She would like to increase the dosage. )        Assessment/ Plan:     Assessment & Plan  1. Diabetes.  Her last recorded A1c was 7.3. A recheck of her A1c with labs and a urine test will be conducted. She will be contacted via MyChart once the lab results are available. A low-carb diet is recommended for weight management.    2. Diabetic neuropathy.  The numbness in the big toe is not a typical distribution for diabetic neuropathy, as it is more pronounced at the base and symmetrical affecting all toes. The numbness will be monitored. She is advised to control her sugars to minimize toxic exposure to the nerves. Daily foot inspections are recommended. Information regarding diabetic foot care will be provided. Should the numbness become painful or uncomfortable, medication may be prescribed such as gabapentin, Lyrica, TCAs, duloxetine. If any sores develop, she should seek immediate medical attention.       1. Type 2 diabetes mellitus without complication, without long-term current use of insulin (HCC)  -     Semaglutide, 1 MG/DOSE, (OZEMPIC, 1 MG/DOSE,) 4 MG/3ML SOPN sc injection; Inject 1 mg into the skin every 7 days, Disp-3 mL, R-1Normal  -     Hemoglobin A1C; Future  -     Microalbumin / Creatinine Urine Ratio; Future  -     Basic Metabolic Panel; Future  2. Numbness of toes         Return for 1 month virtual and 3 month in office follow-up diabetes.      I have discussed the diagnosis with the patient and the intended plan as seen in the above orders.    Medication Side Effects and Warnings were discussed with patient  The patient was instructed to access after-visit summary via mychart and questions were answered concerning future plans.    Patient conveyed understanding of plan.         Current Outpatient Medications   Medication Sig    Semaglutide, 1 MG/DOSE, (OZEMPIC, 1  MG/DOSE,) 4 MG/3ML SOPN sc injection Inject 1 mg into the skin every 7 days    glipiZIDE (GLUCOTROL XL) 2.5 MG extended release tablet Take 1 tablet by mouth daily    losartan (COZAAR) 25 MG tablet Take 1 tablet by mouth daily    valACYclovir (VALTREX) 1 g tablet TAKE 2 TABLETS BY MOUTH TWICE DAILY FOR INITIAL OUTBREAK    venlafaxine (EFFEXOR) 100 MG tablet Take 1 tablet by mouth daily    Drospirenone (SLYND) 4 MG TABS Take 4 mg by mouth daily    metFORMIN (GLUCOPHAGE-XR) 500 MG extended release tablet TAKE TWO TABLETS BY MOUTH TWO TIMES A DAY    atorvastatin (LIPITOR) 40 MG tablet TAKE ONE TABLET BY MOUTH DAILY    vitamin D (CHOLECALCIFEROL) 25 MCG (1000 UT) TABS tablet Take by mouth daily    clindamycin (CLEOCIN T) 1 % external solution use thin film on affected area two times daily.    famotidine (PEPCID) 20 MG tablet Take 1 tablet by mouth 2 times daily     No current facility-administered medications for this visit.       Subjective:     Chief Complaint   Patient presents with    Weight Management     Patient is doing well on Ozempic. She would like to  increase the dosage.        The patient was located at Other: parked in her vehicle    History of Present Illness  The patient presents via virtual visit for evaluation of diabetes.    She reports feeling well overall. She has resumed Ozempic treatment, which she tolerates well, but acknowledges the need for an increased dosage. She experiences constipation as a side effect of Ozempic. She has lost approximately 5 pounds recently. She monitors her blood sugar levels at home, which range between 110 and 130 in the morning. She has been making lifestyle changes, including increased physical activity, and reports no aches or pains during exercise.    She has been experiencing numbness in her left big toe for the past few days. She is unsure of the cause, but notes that she can still feel it. She speculates that one of her shoes might be the cause. She does not  typically wear shoes with a narrow toe box due to her wide feet. She denies any associated burning or tingling. The numbness is localized to the side of her toe.         PMHx, RxHx, FmHx, SocHx, AllgHx were reviewed and updated in the chart.    Review of Systems - negative except as noted above      Objective:     Patient-Reported Vitals  No data recorded         Physical Exam  Nursing note reviewed.   Constitutional:       General: She is not in acute distress.     Appearance: Normal appearance. She is obese. She is not ill-appearing or toxic-appearing.   HENT:      Head: Normocephalic and atraumatic.   Eyes:      General:         Right eye: No discharge.         Left eye: No discharge.      Conjunctiva/sclera: Conjunctivae normal.   Pulmonary:      Effort: Pulmonary effort is normal. No respiratory distress (able to speak in complete sentences).   Musculoskeletal:      Cervical back: No rigidity.   Neurological:      Mental Status: She is alert.      Comments: No obvious neurological deficits within the limits of virtual encounter.   Psychiatric:         Mood and Affect: Mood normal.         Behavior: Behavior normal.         Thought Content: Thought content normal.         Judgment: Judgment normal.               I was located in the office while conducting this encounter.    Total Time: minutes: 5-10 minutes      Nuriya Lazier is a 42 y.o. female who was evaluated through a synchronous (real-time) audio-video encounter. The patient (or guardian if applicable) is aware that this is a billable service, which includes applicable co-pays. This Virtual Visit was conducted with patient's (and/or legal guardian's) consent. Patient identification was verified, and a caregiver was present when appropriate.     The patient (or guardian, if applicable) and other individuals in attendance with the patient were advised that Artificial Intelligence will be utilized during this visit to record and process the conversation to  generate a clinical note. The patient (or guardian, if applicable) and other individuals in attendance at the appointment  consented to the use of AI, including the recording.        --Valda Lamb, MD on 07/22/22 at 7:54 AM EDT  An electronic signature was used to authenticate this note.

## 2022-07-22 NOTE — Patient Instructions (Signed)
The lab orders have been placed.  Here are some options on how to have labs done:    - drawn at my office  To schedule this you would need to call and speak to the front desk regarding lab appointments, the call center does not have access to the schedule so make sure you are speaking to someone at my office.    - go to a [facility] lab  [email or web address]    - go to a [facility] or another outside lab  You can also print the lab order slip through your mychart under upcoming tests and procedures.

## 2022-07-22 NOTE — Progress Notes (Signed)
Chief Complaint   Patient presents with    Weight Management     Patient is doing well on Ozempic. She would like to increase the dosage.          Patient confirms they are located here in [place] for this virtual visit today.    Patient gave verbal consent today for [facility].    "Have you been to the ER, urgent care clinic since your last visit?  Hospitalized since your last visit?"    NO    "Have you seen or consulted any other health care providers outside of [facility] since your last visit?"    NO    Have you had a mammogram?"   NO    Date of last Mammogram: 08/08/2020             Click Here for Release of Records Request

## 2022-09-10 ENCOUNTER — Encounter

## 2022-09-10 MED ORDER — LOSARTAN POTASSIUM 25 MG PO TABS
25 | ORAL_TABLET | Freq: Every day | ORAL | 1 refills | Status: DC
Start: 2022-09-10 — End: 2023-03-03

## 2022-09-10 MED ORDER — VENLAFAXINE HCL 100 MG PO TABS
100 | ORAL_TABLET | Freq: Every day | ORAL | 1 refills | Status: DC
Start: 2022-09-10 — End: 2023-03-03

## 2022-09-16 NOTE — Telephone Encounter (Signed)
 Pt came into the office and dropped off the health statement form that she would like to have completed for her job.      Please call Pt at [phone] when form is ready to be picked up    Form has been placed in the mailbox in the front office

## 2022-09-18 ENCOUNTER — Encounter

## 2022-09-19 LAB — SPECIMEN STATUS REPORT

## 2022-09-19 LAB — BASIC METABOLIC PANEL
BUN/Creatinine Ratio: 11 (ref 9–23)
BUN: 11 mg/dL (ref 6–24)
CO2: 21 mmol/L (ref 20–29)
Calcium: 9.9 mg/dL (ref 8.7–10.2)
Chloride: 101 mmol/L (ref 96–106)
Creatinine: 0.96 mg/dL (ref 0.57–1.00)
Est, Glom Filt Rate: 76 mL/min/{1.73_m2} (ref >59)
Glucose: 127 mg/dL — ABNORMAL HIGH (ref 70–99)
Potassium: 4 mmol/L (ref 3.5–5.2)
Sodium: 139 mmol/L (ref 134–144)

## 2022-09-19 LAB — ALBUMIN/CREATININE RATIO, URINE
Albumin, U: 27.6 ug/mL
Albumin/Creatinine Ratio: 13 mg/g{creat} (ref 0–29)
Creatinine, Ur: 204.9 mg/dL

## 2022-09-19 LAB — HEMOGLOBIN A1C: Hemoglobin A1C: 7.1 % — ABNORMAL HIGH (ref 4.8–5.6)

## 2022-10-02 ENCOUNTER — Encounter

## 2022-10-02 MED ORDER — SLYND 4 MG PO TABS
4 | ORAL_TABLET | Freq: Every day | ORAL | 3 refills | Status: DC
Start: 2022-10-02 — End: 2022-10-07

## 2022-10-03 ENCOUNTER — Encounter

## 2022-10-07 MED ORDER — DROSPIRENONE 4 MG PO TABS
4 | ORAL_TABLET | Freq: Every day | ORAL | 5 refills | Status: DC
Start: 2022-10-07 — End: 2023-03-03

## 2022-10-15 ENCOUNTER — Encounter

## 2022-10-21 MED ORDER — MEDROXYPROGESTERONE ACETATE 150 MG/ML IM SUSP
150 MG/ML | INTRAMUSCULAR | 1 refills | Status: DC
Start: 2022-10-21 — End: 2023-03-03

## 2022-10-28 NOTE — Telephone Encounter (Signed)
Called pt to make her an apt, and she stated she found other meds and wont need to come in for depo.

## 2022-10-29 ENCOUNTER — Encounter

## 2022-10-29 MED ORDER — GLIPIZIDE ER 2.5 MG PO TB24
2.5 MG | ORAL_TABLET | Freq: Every day | ORAL | 1 refills | Status: DC
Start: 2022-10-29 — End: 2023-03-03

## 2022-10-29 MED ORDER — ATORVASTATIN CALCIUM 40 MG PO TABS
40 MG | ORAL_TABLET | Freq: Every day | ORAL | 1 refills | Status: DC
Start: 2022-10-29 — End: 2023-03-03

## 2023-02-03 MED ORDER — VALACYCLOVIR HCL 1 G PO TABS
1 g | ORAL_TABLET | Freq: Two times a day (BID) | ORAL | 3 refills | Status: DC
Start: 2023-02-03 — End: 2023-03-03

## 2023-02-04 ENCOUNTER — Encounter: Payer: PRIVATE HEALTH INSURANCE | Attending: Family Medicine

## 2023-02-07 ENCOUNTER — Encounter: Payer: PRIVATE HEALTH INSURANCE | Primary: Family Medicine

## 2023-02-14 ENCOUNTER — Encounter: Payer: PRIVATE HEALTH INSURANCE | Primary: Family Medicine

## 2023-02-25 NOTE — Progress Notes (Signed)
Patient's HM shows they are overdue for Mammogram.  Care Everywhere and [facility] files searched.  No results to attach to order nor HM updated.     Appt scheduled for 2/7 cancelled, no future appt found.

## 2023-03-03 ENCOUNTER — Telehealth: Admit: 2023-03-03 | Discharge: 2023-03-03 | Payer: PRIVATE HEALTH INSURANCE | Attending: Family Medicine

## 2023-03-03 DIAGNOSIS — E785 Hyperlipidemia, unspecified: Secondary | ICD-10-CM

## 2023-03-03 MED ORDER — ATORVASTATIN CALCIUM 40 MG PO TABS
40 | ORAL_TABLET | Freq: Every day | ORAL | 1 refills | 90.00000 days | Status: DC
Start: 2023-03-03 — End: 2023-09-22

## 2023-03-03 MED ORDER — DROSPIRENONE 4 MG PO TABS
4 | ORAL_TABLET | Freq: Every day | ORAL | 3 refills | Status: AC
Start: 2023-03-03 — End: ?

## 2023-03-03 MED ORDER — LOSARTAN POTASSIUM 25 MG PO TABS
25 | ORAL_TABLET | Freq: Every day | ORAL | 1 refills | Status: DC
Start: 2023-03-03 — End: 2023-09-22

## 2023-03-03 MED ORDER — VALACYCLOVIR HCL 1 G PO TABS
1 | ORAL_TABLET | Freq: Two times a day (BID) | ORAL | 3 refills | Status: DC
Start: 2023-03-03 — End: 2023-05-02

## 2023-03-03 MED ORDER — VENLAFAXINE HCL 100 MG PO TABS
100 | ORAL_TABLET | Freq: Every day | ORAL | 1 refills | Status: DC
Start: 2023-03-03 — End: 2023-09-22

## 2023-03-03 MED ORDER — OZEMPIC (1 MG/DOSE) 4 MG/3ML SC SOPN
4 | SUBCUTANEOUS | 1 refills | Status: DC
Start: 2023-03-03 — End: 2023-05-02
  Filled 2023-03-17: qty 3, 28d supply, fill #0

## 2023-03-03 MED ORDER — METFORMIN HCL ER 500 MG PO TB24
500 | ORAL_TABLET | ORAL | 1 refills | Status: DC
Start: 2023-03-03 — End: 2023-09-22

## 2023-03-03 MED ORDER — GLIPIZIDE ER 2.5 MG PO TB24
2.5 | ORAL_TABLET | Freq: Every day | ORAL | 1 refills | Status: DC
Start: 2023-03-03 — End: 2023-09-22

## 2023-03-03 NOTE — Progress Notes (Signed)
 Virtual Visit Progress Note    she is a 43 y.o. year old female who presents for evaluation Medication Check and Lab results        Assessment/ Plan:     Assessment & Plan  1. Weight management.  He has lost about 20 pounds over the last couple of months and attributes this to consistent use of Ozempic 1 mg. A prescription for Ozempic 1 mg will be refilled and sent to [facility] at [address], [place]. If his A1c remains high, he is willing to increase the dose to 2 mg.    2. Blood pressure management.  His blood pressure has been stable with no reported side effects from his current medication. A prescription for his blood pressure medication will be refilled and sent to [facility] at [address], [place].    3. Diabetes mellitus.  He has been using Ozempic 1 mg consistently, which has helped manage his blood sugar levels. A prescription for Ozempic 1 mg will be refilled and sent to [facility] at [address], [place]. If his A1c remains high, he is willing to increase the dose to 2 mg.    4. Cholesterol management.  A prescription for his cholesterol medication will be refilled and [facility] to [facility] at [address], [place]. Fasting labs will be ordered to monitor his cholesterol levels.    5. Health maintenance.  Fasting labs will be ordered to update his thyroid and blood counts. Dana Terry will reach out to schedule the lab appointment.       1. Hyperlipidemia, unspecified hyperlipidemia type  -     atorvastatin (LIPITOR) 40 MG tablet; Take 1 tablet by mouth daily, Disp-90 tablet, R-1Normal  2. Irregular menses  -     Drospirenone 4 MG TABS; Take 1 tablet by mouth daily, Disp-84 tablet, R-3Normal  3. Type 2 diabetes mellitus with hyperglycemia, without long-term current use of insulin (HCC)  -     glipiZIDE (GLUCOTROL XL) 2.5 MG extended release tablet; Take 1 tablet by mouth daily, Disp-90 tablet, R-1Normal  -     metFORMIN (GLUCOPHAGE-XR) 500 MG extended release tablet; TAKE TWO TABLETS BY MOUTH TWO TIMES A DAY, Disp-360  tablet, R-1Normal  -     CBC; Future  -     Comprehensive Metabolic Panel; Future  -     Hemoglobin A1C; Future  -     Lipid Panel; Future  -     TSH; Future  4. Primary hypertension  -     losartan (COZAAR) 25 MG tablet; Take 1 tablet by mouth daily, Disp-90 tablet, R-1Normal  5. Type 2 diabetes mellitus without complication, without long-term current use of insulin (HCC)  -     Semaglutide, 1 MG/DOSE, (OZEMPIC, 1 MG/DOSE,) 4 MG/3ML SOPN sc injection; Inject 1 mg into the skin every 7 days, Disp-3 mL, R-1Normal  6. Anxiety and depression  -     venlafaxine (EFFEXOR) 100 MG tablet; Take 1 tablet by mouth daily, Disp-90 tablet, R-1Normal  7. Recurrent cold sores  -     valACYclovir (VALTREX) 1 g tablet; Take 2 tablets by mouth 2 times daily, Disp-4 tablet, R-3Normal         I have discussed the diagnosis with the patient and the intended plan as seen in the above orders.    Medication Side Effects and Warnings were discussed with patient  The patient was instructed to access after-visit summary via mychart and questions were answered concerning future plans.  Patient conveyed understanding of plan.       Current Outpatient Medications   Medication Sig    atorvastatin (LIPITOR) 40 MG tablet Take 1 tablet by mouth daily    Drospirenone 4 MG TABS Take 1 tablet by mouth daily    glipiZIDE (GLUCOTROL XL) 2.5 MG extended release tablet Take 1 tablet by mouth daily    losartan (COZAAR) 25 MG tablet Take 1 tablet by mouth daily    metFORMIN (GLUCOPHAGE-XR) 500 MG extended release tablet TAKE TWO TABLETS BY MOUTH TWO TIMES A DAY    Semaglutide, 1 MG/DOSE, (OZEMPIC, 1 MG/DOSE,) 4 MG/3ML SOPN sc injection Inject 1 mg into the skin every 7 days    valACYclovir (VALTREX) 1 g tablet Take 2 tablets by mouth 2 times daily    venlafaxine (EFFEXOR) 100 MG tablet Take 1 tablet by mouth daily    vitamin D (CHOLECALCIFEROL) 25 MCG (1000 UT) TABS tablet Take by mouth daily    clindamycin (CLEOCIN T) 1 % external solution use thin film  on affected area two times daily.    famotidine (PEPCID) 20 MG tablet Take 1 tablet by mouth 2 times daily     No current facility-administered medications for this visit.         Subjective:     Chief Complaint   Patient presents with    Medication Check    Lab results     History of Present Illness  The patient is a 43 year old female who presents for evaluation of weight management, hypertension, and diabetes.    He reports overall good health, with his blood pressure remaining within normal limits. He has not experienced any adverse effects from his current medication regimen.    He has been adhering to a healthy lifestyle, incorporating regular exercise and maintaining a balanced diet. Over the past few months, he has achieved a weight loss of approximately 20 pounds, which he attributes to the consistent use of Ozempic 1 mg. His last laboratory tests were conducted in September 2024, and he acknowledges the need for updated labs. He is seeking refills for all his medications, including those for blood pressure, cholesterol, Slynd, and Ozempic. He anticipates that his insurance will cover these costs upon switching pharmacies.    He has been on a consistent regimen of Ozempic 1 mg for the past 3 months, which has effectively managed his blood glucose levels. He expresses willingness to increase the dosage to 2 mg if his A1c levels remain elevated.    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    MEDICATIONS  Current: Slynd, Ozempic         PMHx, RxHx, FmHx, SocHx, AllgHx were reviewed and updated in the chart.    Review of Systems - negative except as noted above      Objective:     Patient-Reported Vitals  No data recorded     Physical Exam  Nursing note reviewed.   Constitutional:       General: She is not in acute distress.     Appearance: Normal appearance. She is not ill-appearing or toxic-appearing.   HENT:      Head:  Normocephalic and atraumatic.   Eyes:      General:         Right eye: No discharge.         Left eye: No discharge.      Conjunctiva/sclera:  Conjunctivae normal.   Pulmonary:      Effort: Pulmonary effort is normal. No respiratory distress (able to speak in complete sentences).   Musculoskeletal:      Cervical back: No rigidity.   Neurological:      Mental Status: She is alert.      Comments: No obvious neurological deficits within the limits of virtual encounter.   Psychiatric:         Mood and Affect: Mood normal.         Behavior: Behavior normal.         Thought Content: Thought content normal.         Judgment: Judgment normal.             Dana Terry, was evaluated through a synchronous (real-time) audio-video encounter. The patient (or guardian if applicable) is aware that this is a billable service, which includes applicable co-pays. This Virtual Visit was conducted with patient's (and/or legal guardian's) consent. Patient identification was verified, and a caregiver was present when appropriate.   The patient was located at Home: [address]  [name] [address]  Provider was located at Facility (Appt Dept): [address]  [address]  [place],  [place] [address]  Confirm you are appropriately licensed, registered, or certified to deliver care in the state where the patient is located as indicated above. If you are not or unsure, please re-schedule the visit: Yes, I confirm.     The patient (or guardian, if applicable) and other individuals in attendance with the patient were advised that Artificial Intelligence will be utilized during this visit to record, process the conversation to generate a clinical note, and support improvement of the AI technology. The patient (or guardian, if applicable) and other individuals in attendance at the appointment consented to the use of AI, including the recording.     Total time spent on this encounter: Not billed by time    --Valda Lamb, MD on 03/03/23 at  9:48 AM EST  An electronic signature was used to authenticate this note.

## 2023-03-03 NOTE — Progress Notes (Signed)
 The patient (or guardian, if applicable) and other individuals in attendance with the patient were advised that Artificial Intelligence will be utilized during this visit to record, process the conversation to generate a clinical note, and support improvement of the AI technology. The patient (or guardian, if applicable) and other individuals in attendance at the appointment consented to the use of AI, including the recording.      Patient confirms they are located here in [place] for this virtual visit today.    Dana Terry is a 43 y.o. female , id x 2(name and DOB). Reviewed record, history, and  medications.    Chief Complaint   Patient presents with    Medication Check    Lab results        Health Maintenance Due   Topic    Varicella vaccine (1 of 2 - 13+ 2-dose series)    HIV screen     Hepatitis B vaccine (1 of 3 - 19+ 3-dose series)    Pneumococcal 0-49 years Vaccine (2 of 2 - PCV)    Breast cancer screen     Flu vaccine (1)    COVID-19 Vaccine (4 - 2024-25 season)    Diabetic foot exam     Lipids     Depression Monitoring        Wt Readings from Last 1 Encounters:   03/13/22 110.2 kg (243 lb)     BP Readings from Last 3 Encounters:   03/13/22 119/89   10/09/21 128/87   11/22/20 (!) 127/92     Pulse Readings from Last 1 Encounters:   03/13/22 96       Surgery within the next month: no    Forms for completion: no    Chest pain/SOB: no        Depression Screening:  :     Depression: Not at risk (03/03/2023)    PHQ-2     PHQ-2 Score: 0          Coordination of Care Questionnaire:  :     "Have you been to the ER, urgent care clinic since your last visit?  Hospitalized since your last visit?"    NO    "Have you seen or consulted any other health care providers outside of [facility] since your last visit?"    NO    Have you had a mammogram?"   NO    Date of last Mammogram: 08/08/2020       Click Here for Release of Records Request      --Michae Kava, CMA        ------------------------------------------------

## 2023-03-03 NOTE — Patient Instructions (Signed)
 Patient Education        A Healthy Lifestyle: Care Instructions  A healthy lifestyle can help you feel good, have more energy, and stay at a weight that's healthy for you. You can share a healthy lifestyle with your friends and family. And you can do it on your own.    Eat meals with your friends or family. You could try cooking together.   Plan activities with other people. Go for a walk with a friend, try a free online fitness class, or join a sports league.     Eat a variety of healthy foods. These include fruits, vegetables, whole grains, low-fat dairy, and lean protein.   Choose healthy portions of food. You can use the Nutrition Facts label on food packages as a guide.     Eat more fruits and vegetables. You could add vegetables to sandwiches or add fruit to cereal.   Drink water when you are thirsty. Limit soda, juice, and sports drinks.     Try to exercise most days. Aim for at least 2 hours of exercise each week.   Keep moving. Work in the garden or take your dog on a walk. Use the stairs instead of the elevator.     If you use tobacco or nicotine, try to quit. Ask your doctor about programs and medicines to help you quit.   Limit alcohol. Men should have no more than 2 drinks a day. Women should have no more than 1. For some people, no alcohol is the best choice.   Follow-up care is a key part of your treatment and safety. Be sure to make and go to all appointments, and call your doctor if you are having problems. It's also a good idea to know your test results and keep a list of the medicines you take.  Where can you learn more?  Go to [employer] and enter U807 to learn more about "A Healthy Lifestyle: Care Instructions."  Current as of: May 07, 2022  Content Version: 14.3   [address] [place].   Care instructions adapted under license by [facility]. If you have questions about a medical condition or this instruction, always ask your healthcare professional. Larene Beach, [facility], disclaims any warranty or liability for your use of this information.

## 2023-03-11 ENCOUNTER — Encounter

## 2023-03-12 LAB — TSH: TSH, 3rd Generation: 1.84 u[IU]/mL (ref 0.36–3.74)

## 2023-03-12 LAB — COMPREHENSIVE METABOLIC PANEL
ALT: 17 U/L (ref 12–78)
AST: 15 U/L (ref 15–37)
Albumin/Globulin Ratio: 0.9 — ABNORMAL LOW (ref 1.1–2.2)
Albumin: 3.6 g/dL (ref 3.5–5.0)
Alk Phosphatase: 64 U/L (ref 45–117)
Anion Gap: 9 mmol/L (ref 2–12)
BUN/Creatinine Ratio: 16 (ref 12–20)
BUN: 16 mg/dL (ref 6–20)
CO2: 23 mmol/L (ref 21–32)
Calcium: 9.6 mg/dL (ref 8.5–10.1)
Chloride: 106 mmol/L (ref 97–108)
Creatinine: 1.02 mg/dL (ref 0.55–1.02)
Est, Glom Filt Rate: 70 mL/min/{1.73_m2} (ref >60)
Globulin: 3.9 g/dL (ref 2.0–4.0)
Glucose: 152 mg/dL — ABNORMAL HIGH (ref 65–100)
Potassium: 3.8 mmol/L (ref 3.5–5.1)
Sodium: 138 mmol/L (ref 136–145)
Total Bilirubin: 0.9 mg/dL (ref 0.2–1.0)
Total Protein: 7.5 g/dL (ref 6.4–8.2)

## 2023-03-12 LAB — CBC
Hematocrit: 36.4 % (ref 35.0–47.0)
Hemoglobin: 12 g/dL (ref 11.5–16.0)
MCH: 28.2 pg (ref 26.0–34.0)
MCHC: 33 g/dL (ref 30.0–36.5)
MCV: 85.6 FL (ref 80.0–99.0)
MPV: 10.8 FL (ref 8.9–12.9)
Nucleated RBCs: 0 /100{WBCs}
Platelets: 292 10*3/uL (ref 150–400)
RBC: 4.25 M/uL (ref 3.80–5.20)
RDW: 14.6 % — ABNORMAL HIGH (ref 11.5–14.5)
WBC: 10.9 10*3/uL (ref 3.6–11.0)
nRBC: 0 10*3/uL (ref 0.00–0.01)

## 2023-03-12 LAB — LIPID PANEL
Chol/HDL Ratio: 2 (ref 0.0–5.0)
Cholesterol, Total: 111 mg/dL (ref <200)
HDL: 56 mg/dL
LDL Cholesterol: 39.4 mg/dL (ref 0–100)
Triglycerides: 78 mg/dL (ref <150)
VLDL Cholesterol Calculated: 15.6 mg/dL

## 2023-03-12 LAB — HEMOGLOBIN A1C
Estimated Avg Glucose: 143 mg/dL
Hemoglobin A1C: 6.6 % — ABNORMAL HIGH (ref 4.0–5.6)

## 2023-04-10 MED FILL — OZEMPIC (1 MG/DOSE) 4MG/3ML SOPN: 4 4 MG/3ML | SUBCUTANEOUS | 28 days supply | Qty: 3 | Fill #1 | Status: AC

## 2023-04-30 ENCOUNTER — Encounter

## 2023-05-02 ENCOUNTER — Ambulatory Visit: Admit: 2023-05-02 | Discharge: 2023-05-02 | Payer: BLUE CROSS/BLUE SHIELD | Attending: Family Medicine

## 2023-05-02 ENCOUNTER — Encounter

## 2023-05-02 VITALS — BP 117/86 | HR 111 | Resp 18 | Ht 60.0 in | Wt 226.0 lb

## 2023-05-02 DIAGNOSIS — Z0000 Encounter for general adult medical examination without abnormal findings: Secondary | ICD-10-CM

## 2023-05-02 MED ORDER — VALACYCLOVIR HCL 1 G PO TABS
1 | ORAL_TABLET | Freq: Two times a day (BID) | ORAL | 3 refills | 30.00000 days | Status: DC
Start: 2023-05-02 — End: 2023-11-14

## 2023-05-02 MED ORDER — OZEMPIC (1 MG/DOSE) 4 MG/3ML SC SOPN
4 | SUBCUTANEOUS | 1 refills | 56.00000 days | Status: DC
Start: 2023-05-02 — End: 2023-09-22
  Filled 2023-05-05: qty 3, 28d supply, fill #0

## 2023-05-02 NOTE — Progress Notes (Unsigned)
 The patient (or guardian, if applicable) and other individuals in attendance with the patient were advised that Artificial Intelligence will be utilized during this visit to record, process the conversation to generate a clinical note, and support improvement of the AI technology. The patient (or guardian, if applicable) and other individuals in attendance at the appointment consented to the use of AI, including the recording.        Dana Terry is a 43 y.o. female , id x 2(name and DOB). Reviewed record, history, and  medications.    Chief Complaint   Patient presents with    Diabetes        Health Maintenance Due   Topic    Varicella vaccine (1 of 2 - 13+ 2-dose series)    HIV screen     Hepatitis B vaccine (1 of 3 - 19+ 3-dose series)    Pneumococcal 0-49 years Vaccine (2 of 2 - PCV)    Breast cancer screen     COVID-19 Vaccine (4 - 2024-25 season)    Diabetic foot exam        Wt Readings from Last 1 Encounters:   05/02/23 102.5 kg (226 lb)     BP Readings from Last 3 Encounters:   05/02/23 117/86   03/13/22 119/89   10/09/21 128/87     Pulse Readings from Last 1 Encounters:   05/02/23 (!) 111         Patient is currently accompanied by self & I have received verbal consent from Ellie Gutta to discuss any/all medical information while he/she is present in the room.    Home BP cuff present today:  no    Home medication list or bottles present today: no  - All medications were reviewed and updated with the patient today in office.    Forms for completion: no    Chest pain/SOB no    Surgery within the next month:  no      Depression Screening:  :     Depression: Not at risk (03/03/2023)    PHQ-2     PHQ-2 Score: 0          Coordination of Care Questionnaire:  :     "Have you been to the ER, urgent care clinic since your last visit?  Hospitalized since your last visit?"    NO    "Have you seen or consulted any other health care providers outside of [facility] since your last visit?"     NO    Have you had a mammogram?"   NO    Date of last Mammogram: 08/08/2020       Click Here for Release of Records Request      --Lenord Radon, CMA       ------------------------------------------------

## 2023-05-02 NOTE — Progress Notes (Signed)
 Progress Note    she is a 43 y.o. year old female who presents for evaluation of Diabetes        Assessment/ Plan:     1. Well adult on routine health check  2. Type 2 diabetes mellitus without complication, without long-term current use of insulin (HCC)  3. Vitamin D deficiency  -     Vitamin D 25 Hydroxy; Future  4. Snoring  -     [facility] [place]  5. Encounter for screening for HIV  -     HIV 1/2 Ag/Ab, 4TH Generation,W Rflx Confirm; Future  6. Encounter for screening mammogram for malignant neoplasm of breast  -     MAM DIGITAL SCREEN W OR WO CAD BILATERAL; Future    Assessment & Plan  1. Diabetes Mellitus.  - A1c last month was 6.6, indicating good control.  - Fasting blood sugars regularly run around 120; recent elevation due to a cold.  - Kidneys, blood counts, thyroid, and cholesterol are within normal limits.  - Continue current diabetes management plan.    2. Hyperlipidemia.  - Cholesterol levels are at goal.  - Currently taking atorvastatin  with no reported side effects.  - Continue atorvastatin  as prescribed.    3. Health Maintenance.  - Received 1 dose of PPSV23 vaccine; advised to check pharmacy for PCV vaccine.  - Mammogram order will be placed as she needs to reschedule it.  - HIV screening and vitamin D levels will be updated today.  - COVID-19 vaccination records are accurate with three doses documented.    4. Mood disorder.  - Currently taking Effexor  100 mg once daily; mood has been good despite recent stress.  - Recent stress due to mother-in-law's sudden death last month.  - Continue current medication regimen; advised to reach out if feeling overwhelmed or mood worsens.    5. Sleep issues.  - Sleep is generally good but sometimes feels unrefreshed.  - Interested in getting another sleep study done, this time in person.  - Referral for a sleep study will be made.    Follow-up  - Follow up in 4 months.        I have discussed the diagnosis with the patient and the  intended plan as seen in the above orders.    The patient has received an after-visit summary and questions were answered concerning future plans.   Pt conveyed understanding of plan.    Medication Side Effects and Warnings were discussed with patient    Current Outpatient Medications   Medication Sig    atorvastatin  (LIPITOR) 40 MG tablet Take 1 tablet by mouth daily    Drospirenone  4 MG TABS Take 1 tablet by mouth daily    glipiZIDE  (GLUCOTROL  XL) 2.5 MG extended release tablet Take 1 tablet by mouth daily    losartan  (COZAAR ) 25 MG tablet Take 1 tablet by mouth daily    metFORMIN  (GLUCOPHAGE -XR) 500 MG extended release tablet TAKE TWO TABLETS BY MOUTH TWO TIMES A DAY    Semaglutide , 1 MG/DOSE, (OZEMPIC , 1 MG/DOSE,) 4 MG/3ML SOPN sc injection Inject 1 mg into the skin every 7 days    valACYclovir  (VALTREX ) 1 g tablet Take 2 tablets by mouth 2 times daily    venlafaxine  (EFFEXOR ) 100 MG tablet Take 1 tablet by mouth daily    vitamin D (CHOLECALCIFEROL) 25 MCG (1000 UT) TABS tablet Take by mouth daily    clindamycin (CLEOCIN T) 1 % external solution use thin  film on affected area two times daily.    famotidine (PEPCID) 20 MG tablet Take 1 tablet by mouth 2 times daily     No current facility-administered medications for this visit.         Subjective:     Chief Complaint   Patient presents with    Diabetes     History of Present Illness  The patient presents for a follow-up visit.    A satisfactory weight loss is reported, and a healthy lifestyle is maintained through regular walking exercises. Consideration is given to incorporating Pilates into the routine. Diabetes management is effective, with fasting blood glucose levels typically around 120. An elevated level was noted this morning, attributed to a recent cold and the use of cold medication. Atorvastatin  is continued without any adverse effects.    The second dose of the pneumonia vaccine has not been received, and HIV screening has not been done recently.  Three doses of the COVID-19 vaccine are confirmed. The last mammogram appointment was canceled and needs rescheduling. No ocular or auditory issues are reported, and regular check-ups with an ophthalmologist are maintained. No presence of lumps, bumps, pain, chest discomfort, pressure, or palpitations is reported. No respiratory distress during physical activity such as walking is experienced. No gastrointestinal or urinary issues, including hematuria or hematochezia, are reported. Birth control pills are currently taken, and no joint or leg swelling is reported.    Sleep quality is generally good, although occasional feelings of unrest upon waking are noted. Interest in undergoing another sleep study is expressed, as the previous one was conducted at home.    Mood remains stable despite recent stressors, including the sudden death of the mother-in-law. Effexor  100 mg daily regimen is continued.    A chronic back injury is managed effectively.    FAMILY HISTORY  Her mother has had diabetes for approximately 30 years without complications.      Reviewed PmHx, RxHx, FmHx, SocHx, AllgHx and updated and dated in the chart.    Review of Systems - negative except as listed above in the HPI    Objective:     Vitals:    05/02/23 1028   BP: 117/86   BP Site: Left Upper Arm   Patient Position: Sitting   BP Cuff Size: Large Adult   Pulse: (!) 111   Resp: 18   SpO2: 99%   Weight: 102.5 kg (226 lb)   Height: 1.524 m (5')         Physical Exam  Vitals and nursing note reviewed.   Constitutional:       General: She is not in acute distress.     Appearance: Normal appearance. She is obese. She is not ill-appearing, toxic-appearing or diaphoretic.   HENT:      Head: Normocephalic and atraumatic.      Right Ear: Tympanic membrane, ear canal and external ear normal.      Left Ear: Tympanic membrane, ear canal and external ear normal.      Nose: Nose normal.      Mouth/Throat:      Mouth: Mucous membranes are moist.      Pharynx:  Oropharynx is clear.   Eyes:      General: No scleral icterus.        Right eye: No discharge.         Left eye: No discharge.      Conjunctiva/sclera: Conjunctivae normal.      Pupils: Pupils are equal, round,  and reactive to light.   Cardiovascular:      Rate and Rhythm: Normal rate and regular rhythm.      Pulses: Normal pulses.      Heart sounds: Normal heart sounds.   Pulmonary:      Effort: Pulmonary effort is normal.      Breath sounds: Normal breath sounds.   Abdominal:      General: Bowel sounds are normal. There is no distension.      Palpations: Abdomen is soft. There is no mass.      Tenderness: There is no abdominal tenderness. There is no guarding or rebound.      Hernia: No hernia is present.   Musculoskeletal:         General: No swelling, tenderness or deformity.      Cervical back: No rigidity or tenderness.      Right lower leg: No edema.      Left lower leg: No edema.   Lymphadenopathy:      Cervical: No cervical adenopathy.   Skin:     General: Skin is warm and dry.      Findings: No rash (over visualized areas).   Neurological:      General: No focal deficit present.      Mental Status: She is alert.      Sensory: No sensory deficit.      Motor: No weakness.      Gait: Gait normal.   Psychiatric:         Mood and Affect: Mood normal.         Behavior: Behavior normal.         Thought Content: Thought content normal.         Judgment: Judgment normal.              Jeri Mons, MD    The patient (or guardian, if applicable) and other individuals in attendance with the patient were advised that Artificial Intelligence will be utilized during this visit to record and process the conversation to generate a clinical note. The patient (or guardian, if applicable) and other individuals in attendance at the appointment consented to the use of AI, including the recording.

## 2023-05-02 NOTE — Patient Instructions (Signed)
 Patient Education        A Healthy Lifestyle: Care Instructions  A healthy lifestyle can help you feel good, have more energy, and stay at a weight that's healthy for you. You can share a healthy lifestyle with your friends and family. And you can do it on your own.    Eat meals with your friends or family. You could try cooking together.   Plan activities with other people. Go for a walk with a friend, try a free online fitness class, or join a sports league.     Eat a variety of healthy foods. These include fruits, vegetables, whole grains, low-fat dairy, and lean protein.   Choose healthy portions of food. You can use the Nutrition Facts label on food packages as a guide.     Eat more fruits and vegetables. You could add vegetables to sandwiches or add fruit to cereal.   Drink water when you are thirsty. Limit soda, juice, and sports drinks.     Try to exercise most days. Aim for at least 2 hours of exercise each week.   Keep moving. Work in the garden or take your dog on a walk. Use the stairs instead of the elevator.     If you use tobacco or nicotine, try to quit. Ask your doctor about programs and medicines to help you quit.   Limit alcohol. Men should have no more than 2 drinks a day. Women should have no more than 1. For some people, no alcohol is the best choice.   Follow-up care is a key part of your treatment and safety. Be sure to make and go to all appointments, and call your doctor if you are having problems. It's also a good idea to know your test results and keep a list of the medicines you take.  Where can you learn more?  Go to [employer] and enter U807 to learn more about "A Healthy Lifestyle: Care Instructions."  Current as of: May 07, 2022  Content Version: 14.4   2024-2025 [place], [place].   Care instructions adapted under license by [facility]. If you have questions about a medical condition or this instruction, always ask your healthcare professional.  [place], [facility], disclaims any warranty or liability for your use of this information.

## 2023-05-03 LAB — VITAMIN D 25 HYDROXY: Vit D, 25-Hydroxy: 68.8 ng/mL (ref 30–100)

## 2023-05-03 LAB — HIV 1/2 AG/AB, 4TH GENERATION,W RFLX CONFIRM: HIV 1/2 Interp: NONREACTIVE

## 2023-06-09 MED FILL — OZEMPIC (1 MG/DOSE) 4MG/3ML SOPN: 4 4 MG/3ML | SUBCUTANEOUS | 28 days supply | Qty: 3 | Fill #0 | Status: AC

## 2023-07-07 MED FILL — OZEMPIC (1 MG/DOSE) 4MG/3ML SOPN: 4 4 MG/3ML | SUBCUTANEOUS | 28 days supply | Qty: 3 | Fill #1 | Status: AC

## 2023-09-01 MED FILL — OZEMPIC (1 MG/DOSE) 4MG/3ML SOPN: 4 4 MG/3ML | SUBCUTANEOUS | 28 days supply | Qty: 3 | Fill #2 | Status: AC

## 2023-09-22 ENCOUNTER — Ambulatory Visit: Admit: 2023-09-22 | Discharge: 2023-09-22 | Payer: BLUE CROSS/BLUE SHIELD | Attending: Family

## 2023-09-22 VITALS — BP 110/88 | HR 97 | Temp 98.90000°F | Ht 60.0 in | Wt 222.3 lb

## 2023-09-22 DIAGNOSIS — E785 Hyperlipidemia, unspecified: Principal | ICD-10-CM

## 2023-09-22 LAB — COMPREHENSIVE METABOLIC PANEL
ALT: 17 U/L (ref 10–35)
AST: 19 U/L (ref 10–35)
Albumin/Globulin Ratio: 1 — ABNORMAL LOW (ref 1.1–2.2)
Albumin: 3.7 g/dL (ref 3.5–5.2)
Alk Phosphatase: 55 U/L (ref 35–104)
Anion Gap: 13 mmol/L (ref 2–14)
BUN/Creatinine Ratio: 19 (ref 12–20)
BUN: 18 mg/dL (ref 6–20)
CO2: 23 mmol/L (ref 20–29)
Calcium: 10 mg/dL (ref 8.6–10.0)
Chloride: 102 mmol/L (ref 98–107)
Creatinine: 0.96 mg/dL (ref 0.60–1.00)
Est, Glom Filt Rate: 75 ml/min/1.73m2 (ref >59)
Globulin: 3.9 g/dL (ref 2.0–4.0)
Glucose: 119 mg/dL — ABNORMAL HIGH (ref 65–100)
Potassium: 4.5 mmol/L (ref 3.5–5.1)
Sodium: 137 mmol/L (ref 136–145)
Total Bilirubin: 0.5 mg/dL (ref 0.0–1.2)
Total Protein: 7.7 g/dL (ref 6.4–8.3)

## 2023-09-22 LAB — HEMOGLOBIN A1C
Estimated Avg Glucose: 149 mg/dL
Hemoglobin A1C: 6.8 % — ABNORMAL HIGH (ref 4.0–5.6)

## 2023-09-22 LAB — LIPID PANEL
Chol/HDL Ratio: 2.3 (ref 0.0–5.0)
Cholesterol, Total: 116 mg/dL (ref 0–200)
HDL: 50 mg/dL (ref 40–60)
LDL Cholesterol: 46 mg/dL (ref 0–100)
Triglycerides: 102 mg/dL (ref 0–150)
VLDL Cholesterol Calculated: 20 mg/dL

## 2023-09-22 MED ORDER — ATORVASTATIN CALCIUM 40 MG PO TABS
40 | ORAL_TABLET | Freq: Every day | ORAL | 1 refills | 90.00000 days | Status: AC
Start: 2023-09-22 — End: ?

## 2023-09-22 MED ORDER — GLIPIZIDE ER 2.5 MG PO TB24
2.5 | ORAL_TABLET | Freq: Every day | ORAL | 1 refills | 90.00000 days | Status: AC
Start: 2023-09-22 — End: ?

## 2023-09-22 MED ORDER — VENLAFAXINE HCL 100 MG PO TABS
100 | ORAL_TABLET | Freq: Every day | ORAL | 1 refills | 90.00000 days | Status: AC
Start: 2023-09-22 — End: ?

## 2023-09-22 MED ORDER — LOSARTAN POTASSIUM 25 MG PO TABS
25 | ORAL_TABLET | Freq: Every day | ORAL | 1 refills | 90.00000 days | Status: AC
Start: 2023-09-22 — End: ?

## 2023-09-22 MED ORDER — OZEMPIC (1 MG/DOSE) 4 MG/3ML SC SOPN
4 | SUBCUTANEOUS | 2 refills | 56.00000 days | Status: DC
Start: 2023-09-22 — End: 2023-09-22
  Filled 2023-10-27: qty 3, 28d supply, fill #0

## 2023-09-22 MED ORDER — METFORMIN HCL ER 500 MG PO TB24
500 | ORAL_TABLET | ORAL | 1 refills | 90.00000 days | Status: AC
Start: 2023-09-22 — End: ?

## 2023-09-22 MED ORDER — OZEMPIC (1 MG/DOSE) 4 MG/3ML SC SOPN
4 | SUBCUTANEOUS | 2 refills | 56.00000 days | Status: AC
Start: 2023-09-22 — End: ?

## 2023-09-22 NOTE — Progress Notes (Signed)
 Michelle D Feick is a 43 y.o. female , id x 2(name and DOB). Reviewed record, history, and  medications.    Chief Complaint   Patient presents with    Established New Doctor     Former [facility] pt    Medication Refill       Vitals:    09/22/23 0805   BP: 110/88   Pulse: 97   Temp: 98.9 F (37.2 C)   SpO2: 100%   Weight: 100.8 kg (222 lb 4.8 oz)   Height: 1.524 m (5')       Fasting    Med Review  Reviewed: Medications reviewed no changes.  Compliance: compliant with all meds  List provided: ptmedlist: no      Have you been to the ER, urgent care clinic since your last visit?  Hospitalized since your last visit?    NO    "Have you seen or consulted any other health care providers outside of [facility] since your last visit?"    NO    Have you had a mammogram?"   NO    Date of last Mammogram: 08/08/2020          No data to display                  09/19/2023    11:23 AM   PHQ-9    Little interest or pleasure in doing things 0   Feeling down, depressed, or hopeless 0   Trouble falling or staying asleep, or sleeping too much 0   Feeling tired or having little energy 0   Poor appetite or overeating 0   Feeling bad about yourself - or that you are a failure or have let yourself or your family down 2   Trouble concentrating on things, such as reading the newspaper or watching television 0   Moving or speaking so slowly that other people could have noticed. Or the opposite - being so fidgety or restless that you have been moving around a lot more than usual 0   Thoughts that you would be better off dead, or of hurting yourself in some way 0   PHQ-2 Score 0    PHQ-9 Total Score 2    If you checked off any problems, how difficult have these problems made it for you to do your work, take care of things at home, or get along with other people? 0       Patient-reported         09/19/2023    11:22 AM   GAD-7 SCREENING   Feeling nervous, anxious, or on edge Several days   Not being able to stop or control worrying Several  days   Worrying too much about different things Not at all   Trouble relaxing Not at all   Being so restless that it is hard to sit still Not at all   Becoming easily annoyed or irritable Several days   Feeling afraid as if something awful might happen Not at all   GAD-7 Total Score 3    How difficult have these problems made it for you to do your work, take care of things at home, or get along with other people? Not difficult at all       Patient-reported     SDOH:  Social Drivers of Health     Tobacco Use: Low Risk  (09/22/2023)    Patient History     Smoking Tobacco Use: Never  Smokeless Tobacco Use: Never     Passive Exposure: Not on file   Alcohol Use: Not At Risk (02/03/2017)    Received from [facility]    AUDIT-C     Frequency of Alcohol Consumption: Never     Average Number of Drinks: Not on file     Frequency of Binge Drinking: Not on file   Financial Resource Strain: Low Risk  (10/07/2021)    Overall Financial Resource Strain (CARDIA)     Difficulty of Paying Living Expenses: Not hard at all   Food Insecurity: No Food Insecurity (03/03/2023)    Hunger Vital Sign     Worried About Running Out of Food in the Last Year: Never true     Ran Out of Food in the Last Year: Never true   Transportation Needs: No Transportation Needs (03/03/2023)    PRAPARE - Therapist, art (Medical): No     Lack of Transportation (Non-Medical): No   Physical Activity: Not on file   Stress: Not on file   Social Connections: Not on file   Intimate Partner Violence: Not on file   Depression: Not at risk (09/19/2023)    PHQ-2     PHQ-2 Score: 2   Housing Stability: Low Risk  (09/19/2023)    Housing Stability Vital Sign     Unable to Pay for Housing in the Last Year: No     Number of Times Moved in the Last Year: 0     Homeless in the Last Year: No   Interpersonal Safety: Not on file   Utilities: Not At Risk (03/03/2023)    [facility] Utilities     Threatened with loss of utilities: No       Click Here  for Release of Records Request    Munira Polson C Izabell Schalk, LPN

## 2023-09-22 NOTE — Telephone Encounter (Addendum)
 We received a fax refill request for Dana [facility].  Please escribe Losartan  Potassium to their pharmacy.  The pharmacy is correct in the chart and they are requesting a 90 day supply.  Refill: 1      We received a fax refill request for [facility].  Please escribe Glipizide  ER to their pharmacy.  The pharmacy is correct in the chart and they are requesting a 90 day supply.  Refill: 1  We received a fax refill request for Dana Terry.  Please escribe Venlafaxine  HCL to their pharmacy.  The pharmacy is correct in the chart and they are requesting a 90 day supply.  Refill: 1    We received a fax refill request for Dana Terry.  Please escribe Atorvastatin  to their pharmacy.  The pharmacy is correct in the chart and they are requesting a 90 day supply.  Refill: 1      Patient has an appointment with provider 09.15.2025.

## 2023-09-22 NOTE — Progress Notes (Signed)
 Progress Note        Pt presents for chronic care management, refills, labs, and to establish care with new provider    Medication Refill         Subjective:     Patient's medications, allergies, past medical, surgical, social and family histories were reviewed and updated as appropriate.    Review of Systems   All other systems reviewed and are negative.       Objective:     Vitals:    09/22/23 0805   BP: 110/88   Pulse: 97   Temp: 98.9 F (37.2 C)   SpO2: 100%   Weight: 100.8 kg (222 lb 4.8 oz)   Height: 1.524 m (5')       Physical Exam  Vitals and nursing note reviewed.   Constitutional:       General: She is not in acute distress.     Appearance: Normal appearance. She is obese. She is not ill-appearing.   HENT:      Head: Normocephalic and atraumatic.      Nose: Nose normal.      Mouth/Throat:      Mouth: Mucous membranes are moist.      Pharynx: Oropharynx is clear.   Eyes:      Extraocular Movements: Extraocular movements intact.      Conjunctiva/sclera: Conjunctivae normal.   Cardiovascular:      Rate and Rhythm: Normal rate and regular rhythm.      Pulses: Normal pulses.      Heart sounds: Normal heart sounds.   Pulmonary:      Effort: Pulmonary effort is normal.      Breath sounds: Normal breath sounds.   Musculoskeletal:         General: Normal range of motion.      Cervical back: Normal range of motion and neck supple.      Right lower leg: No edema.      Left lower leg: No edema.   Skin:     General: Skin is warm and dry.   Neurological:      General: No focal deficit present.      Mental Status: She is alert and oriented to person, place, and time.   Psychiatric:         Mood and Affect: Mood normal.         Behavior: Behavior normal.         Assessment/ Plan:     1. Hyperlipidemia, unspecified hyperlipidemia type  -     atorvastatin  (LIPITOR) 40 MG tablet; Take 1 tablet by mouth daily, Disp-90 tablet, R-1Normal  -     Comprehensive Metabolic Panel; Future  -     Lipid Panel; Future  2. Type 2  diabetes mellitus with hyperglycemia, without long-term current use of insulin (HCC)  -     glipiZIDE  (GLUCOTROL  XL) 2.5 MG extended release tablet; Take 1 tablet by mouth daily, Disp-90 tablet, R-1Normal  -     metFORMIN  (GLUCOPHAGE -XR) 500 MG extended release tablet; TAKE TWO TABLETS BY MOUTH TWO TIMES A DAY, Disp-360 tablet, R-1Normal  -     Hemoglobin A1C; Future  -     Comprehensive Metabolic Panel; Future  3. Primary hypertension  -     losartan  (COZAAR ) 25 MG tablet; Take 1 tablet by mouth daily, Disp-90 tablet, R-1Normal  -     Comprehensive Metabolic Panel; Future  4. Type 2 diabetes mellitus without complication, without long-term current use of insulin (  HCC)  -     Semaglutide , 1 MG/DOSE, (OZEMPIC , 1 MG/DOSE,) 4 MG/3ML SOPN sc injection; Inject 1 mg into the skin every 7 days, Disp-9 mL, R-2Normal  -     Comprehensive Metabolic Panel; Future  5. Anxiety and depression  -     venlafaxine  (EFFEXOR ) 100 MG tablet; Take 1 tablet by mouth daily, Disp-90 tablet, R-1Normal      Medication Side Effects and Warnings were discussed with patient    Return in about 6 months (around 03/21/2024) for RECHECK, LABS, PHYSICAL.         I have discussed the diagnosis with the patient and the intended plan as seen in the above orders.      The patient has received an after-visit summary and questions were answered concerning future plans. Pt conveyed understanding of plan.      Dana CHRISTELLA Pata, APRN - CNP

## 2023-09-29 MED FILL — OZEMPIC (1 MG/DOSE) 4MG/3ML SOPN: 4 4 MG/3ML | SUBCUTANEOUS | 28 days supply | Qty: 3 | Fill #3 | Status: AC

## 2023-11-14 ENCOUNTER — Encounter

## 2023-11-14 MED ORDER — VALACYCLOVIR HCL 1 G PO TABS
1 | ORAL_TABLET | Freq: Two times a day (BID) | ORAL | 3 refills | 30.00000 days | Status: AC
Start: 2023-11-14 — End: ?

## 2023-11-14 NOTE — Telephone Encounter (Signed)
"  We received a fax refill request for Dana [facility].  Please escribe Valacyclovir  HCL Tablet to their pharmacy.  The pharmacy is correct in the chart and they are requesting a ? day supply.  Refill: 3  "

## 2023-11-24 ENCOUNTER — Encounter

## 2023-11-24 MED FILL — OZEMPIC (1 MG/DOSE) 4MG/3ML SOPN: 4 4 MG/3ML | SUBCUTANEOUS | 28 days supply | Qty: 3 | Fill #1 | Status: AC

## 2023-12-22 MED FILL — OZEMPIC (1 MG/DOSE) 4MG/3ML SOPN: 4 4 MG/3ML | SUBCUTANEOUS | 28 days supply | Qty: 3 | Fill #2 | Status: AC

## 2024-01-23 MED FILL — OZEMPIC (1 MG/DOSE) 4MG/3ML SOPN: 4 4 MG/3ML | SUBCUTANEOUS | 28 days supply | Qty: 3 | Fill #3 | Status: AC
